# Patient Record
Sex: Male | Born: 1960 | Hispanic: No | Marital: Married | Smoking: Never smoker
Health system: Southern US, Community
[De-identification: ages and names within clinical notes are randomized; demographics above are authoritative.]

## PROBLEM LIST (undated history)

## (undated) DIAGNOSIS — K620 Anal polyp: Secondary | ICD-10-CM

## (undated) DIAGNOSIS — M5412 Radiculopathy, cervical region: Secondary | ICD-10-CM

## (undated) DIAGNOSIS — I10 Essential (primary) hypertension: Secondary | ICD-10-CM

## (undated) DIAGNOSIS — R7303 Prediabetes: Secondary | ICD-10-CM

## (undated) DIAGNOSIS — N400 Enlarged prostate without lower urinary tract symptoms: Secondary | ICD-10-CM

## (undated) DIAGNOSIS — J302 Other seasonal allergic rhinitis: Secondary | ICD-10-CM

## (undated) HISTORY — PX: NO PAST SURGERIES: SHX2092

## (undated) HISTORY — DX: Anal polyp: K62.0

## (undated) HISTORY — DX: Benign prostatic hyperplasia without lower urinary tract symptoms: N40.0

## (undated) HISTORY — DX: Other seasonal allergic rhinitis: J30.2

---

## 1999-04-27 HISTORY — PX: HEMORRHOID SURGERY: SHX153

## 1999-10-09 ENCOUNTER — Ambulatory Visit (HOSPITAL_COMMUNITY): Admission: RE | Admit: 1999-10-09 | Discharge: 1999-10-09 | Payer: Self-pay | Admitting: General Surgery

## 2010-06-09 ENCOUNTER — Other Ambulatory Visit: Payer: Self-pay

## 2010-06-10 ENCOUNTER — Other Ambulatory Visit: Payer: Self-pay

## 2010-06-15 ENCOUNTER — Other Ambulatory Visit (INDEPENDENT_AMBULATORY_CARE_PROVIDER_SITE_OTHER): Payer: Self-pay

## 2010-06-15 DIAGNOSIS — R0989 Other specified symptoms and signs involving the circulatory and respiratory systems: Secondary | ICD-10-CM

## 2010-09-04 ENCOUNTER — Encounter (INDEPENDENT_AMBULATORY_CARE_PROVIDER_SITE_OTHER): Payer: Self-pay | Admitting: Surgery

## 2010-09-23 ENCOUNTER — Encounter: Payer: Self-pay | Admitting: *Deleted

## 2010-09-29 ENCOUNTER — Encounter (INDEPENDENT_AMBULATORY_CARE_PROVIDER_SITE_OTHER): Payer: Self-pay | Admitting: *Deleted

## 2010-09-29 ENCOUNTER — Encounter: Payer: Self-pay | Admitting: Cardiovascular Disease

## 2010-12-23 ENCOUNTER — Ambulatory Visit (INDEPENDENT_AMBULATORY_CARE_PROVIDER_SITE_OTHER): Payer: 59 | Admitting: Surgery

## 2010-12-23 ENCOUNTER — Encounter (INDEPENDENT_AMBULATORY_CARE_PROVIDER_SITE_OTHER): Payer: Self-pay | Admitting: Surgery

## 2010-12-23 ENCOUNTER — Encounter (INDEPENDENT_AMBULATORY_CARE_PROVIDER_SITE_OTHER): Payer: Self-pay

## 2010-12-23 VITALS — BP 142/94 | HR 70 | Temp 96.7°F | Ht 68.5 in | Wt 193.8 lb

## 2010-12-23 DIAGNOSIS — K620 Anal polyp: Secondary | ICD-10-CM

## 2010-12-23 DIAGNOSIS — K621 Rectal polyp: Secondary | ICD-10-CM

## 2010-12-23 NOTE — Progress Notes (Signed)
Subjective:     Patient ID: Tony Hall, male   DOB: 1960-11-15, 50 y.o.   MRN: [id number]  HPI  Reason for visit: Followup on the anal canal polyp.  The patient comes today feeling well. No bleeding. Bowel movements twice a day. No prolapse nor any anall masses. No fevers or chills or sweats.  Review of Systems  Constitutional: Negative for fever, chills and diaphoresis.  HENT: Negative for nosebleeds, sore throat, facial swelling, mouth sores, trouble swallowing and ear discharge.   Eyes: Negative for photophobia, discharge and visual disturbance.  Respiratory: Negative for choking, chest tightness, shortness of breath and stridor.   Cardiovascular: Negative for chest pain and palpitations.  Gastrointestinal: Negative for nausea, vomiting, abdominal pain, diarrhea, constipation, blood in stool, abdominal distention, anal bleeding and rectal pain.  Genitourinary: Negative for dysuria, urgency, difficulty urinating and testicular pain.  Musculoskeletal: Negative for myalgias, back pain, arthralgias and gait problem.  Skin: Negative for color change, pallor, rash and wound.  Neurological: Negative for dizziness, speech difficulty, weakness, numbness and headaches.  Hematological: Negative for adenopathy. Does not bruise/bleed easily.  Psychiatric/Behavioral: Negative for hallucinations, confusion and agitation.       Objective:   Physical Exam  Constitutional: He is oriented to person, place, and time. He appears well-developed and well-nourished. No distress.  HENT:  Head: Normocephalic.  Mouth/Throat: Oropharynx is clear and moist. No oropharyngeal exudate.  Eyes: Conjunctivae and EOM are normal. Pupils are equal, round, and reactive to light. No scleral icterus.  Neck: Normal range of motion. No tracheal deviation present.  Cardiovascular: Regular rhythm and intact distal pulses.   Pulmonary/Chest: Effort normal. No respiratory distress.  Abdominal: Soft. He exhibits no  distension. There is no tenderness. Hernia confirmed negative in the right inguinal area and confirmed negative in the left inguinal area.  Genitourinary: Prostate normal and penis normal. No penile tenderness.       Perianal skin clean.  No masses/tags.  NST.  Right lateral anal tag/polyp.  Conical, soft, 5x86mm.  No wart/ulcer.  No other rectal/anal masses.  Musculoskeletal: Normal range of motion. He exhibits no tenderness.  Lymphadenopathy:       Right: No inguinal adenopathy present.       Left: No inguinal adenopathy present.  Neurological: He is alert and oriented to person, place, and time. No cranial nerve deficit. He exhibits normal muscle tone. Coordination normal.  Skin: Skin is warm and dry. No rash noted. He is not diaphoretic. No erythema. No pallor.  Psychiatric: He has a normal mood and affect. His behavior is normal. Judgment and thought content normal.       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    Plan:     I again offered him options. We could see him in a year to make sure nothing has changed. I feel pretty reassured since nothing is changed in a year the that this is not anything concerning. It does not look like a condyloma. It is not highly suspicious for cancer.  Another reasonable plan is to consider just excision of the mass.  This would be an outpatient procedure. I think it is small &  localized so I my hope that he would not have much risks of bleeding or prolonged pain or other problems.  The anatomy & physiology of the anorectal region was discussed.  The pathophysiology of hemorrhoids and differential diagnosis was discussed.  Natural history risks without surgery was discussed.  Risks such as bleeding, infection, need for further treatment, heart attack, death, and other risks were discussed.  Goals of post-operative recovery were discussed as well.  Possibility that this will not correct all symptoms was explained.   Post-operative pain, bleeding, constipation, and other problems after surgery were discussed.  We will work to minimize complications.   Questions were answered.  The patient expresses understanding & again, he wishes to wait. He feels reassured. I did caution him that I cannot 100% guarantee that this is benign outside of removing it. He understands this.   I will see him in a year for followup.

## 2011-12-29 ENCOUNTER — Ambulatory Visit (INDEPENDENT_AMBULATORY_CARE_PROVIDER_SITE_OTHER): Payer: 59 | Admitting: Surgery

## 2011-12-29 ENCOUNTER — Encounter (INDEPENDENT_AMBULATORY_CARE_PROVIDER_SITE_OTHER): Payer: Self-pay | Admitting: Surgery

## 2011-12-29 VITALS — BP 128/86 | HR 76 | Temp 97.6°F | Resp 16 | Ht 69.5 in | Wt 199.4 lb

## 2011-12-29 DIAGNOSIS — K620 Anal polyp: Secondary | ICD-10-CM

## 2011-12-29 NOTE — Progress Notes (Signed)
Subjective:     Patient ID: Tony Hall, male   DOB: 1961-04-13, 51 y.o.   MRN: [id number]  HPI  Tony Hall  Nov 18, 1960 [id number]  Patient Care Team: Darrow Bussing, MD as PCP - General (Family Medicine) Graylin Shiver, MD as Consulting Physician (Gastroenterology)  This patient is a 51 y.o.male who presents today for surgical evaluation   Reason for visit: Followup on anal canal mass.  Patient comes in today feeling well.  No bleeding.  Occasional itching with his hemorrhoids.  It is mild.  Not worse.  Having twice a day bowel movements on a bowel regimen.  Feels well.  No fevers or chills.  No pain with bowel movements  Patient Active Problem List  Diagnosis  . Anal polyp - benign appearing    Past Medical History  Diagnosis Date  . Anal polyp [id number]    Past Surgical History  Procedure Date  . Hemorrhoid surgery 2001    History   Social History  . Marital Status: Married    Spouse Name: N/A    Number of Children: N/A  . Years of Education: N/A   Occupational History  . Not on file.   Social History Main Topics  . Smoking status: Never Smoker   . Smokeless tobacco: Not on file  . Alcohol Use: Yes     5 GLASSES OF WINE PER WEEK  . Drug Use: No  . Sexually Active:    Other Topics Concern  . Not on file   Social History Narrative  . No narrative on file    Family History  Problem Relation Age of Onset  . Hypertension Father   . Dementia Father     Current Outpatient Prescriptions  Medication Sig Dispense Refill  . Tamsulosin HCl (FLOMAX) 0.4 MG CAPS Take by mouth daily.           No Known Allergies  BP 128/86  Pulse 76  Temp 97.6 F (36.4 C) (Temporal)  Resp 16  Ht 5' 9.5" (1.765 m)  Wt 199 lb 6.4 oz (90.447 kg)  BMI 29.02 kg/m2  No results found.   Review of Systems  Constitutional: Negative for fever, chills and diaphoresis.  HENT: Negative for sore throat, trouble swallowing and neck pain.   Eyes: Negative for photophobia and  visual disturbance.  Respiratory: Negative for choking and shortness of breath.   Cardiovascular: Negative for chest pain and palpitations.  Gastrointestinal: Negative for nausea, vomiting, abdominal distention, anal bleeding and rectal pain.  Genitourinary: Negative for dysuria, urgency, difficulty urinating and testicular pain.  Musculoskeletal: Negative for myalgias, arthralgias and gait problem.  Skin: Negative for color change and rash.  Neurological: Negative for dizziness, speech difficulty, weakness and numbness.  Hematological: Negative for adenopathy.  Psychiatric/Behavioral: Negative for hallucinations, confusion and agitation.       Objective:   Physical Exam  Constitutional: He is oriented to person, place, and time. He appears well-developed and well-nourished. No distress.  HENT:  Head: Normocephalic.  Mouth/Throat: Oropharynx is clear and moist. No oropharyngeal exudate.  Eyes: Conjunctivae and EOM are normal. Pupils are equal, round, and reactive to light. No scleral icterus.  Neck: Normal range of motion. No tracheal deviation present.  Cardiovascular: Normal rate, normal heart sounds and intact distal pulses.   Pulmonary/Chest: Effort normal. No respiratory distress.  Abdominal: Soft. He exhibits no distension. There is no tenderness. Hernia confirmed negative in the right inguinal area and confirmed negative in the left inguinal area.  Incisions clean with normal healing ridges.  No hernias  Genitourinary:       Exam done with assistance of male Medical Assistant in the room.  Perianal skin clean with good hygiene.  No pruritis.  No external skin tags / hemorrhoids of significance.  Mild hypopigmented old scar c/w prior hemorrhoidectomies.  No pilonidal disease.  No fissure.  No abscess/fistula.    Tolerates digital  and anoscopic rectal exam.  Normal sphincter tone.  Hemorrhoidal piles WNL except anal canal fold/polyp on R posterior hemorrhoid.  Soft/smooth no  wart   Musculoskeletal: Normal range of motion. He exhibits no tenderness.  Neurological: He is alert and oriented to person, place, and time. No cranial nerve deficit. He exhibits normal muscle tone. Coordination normal.  Skin: Skin is warm and dry. No rash noted. He is not diaphoretic.  Psychiatric: He has a normal mood and affect. His behavior is normal.       Assessment:     Anal canal mass.  Small polyp.  Favor old scar from hemorrhoidectomy.  No change in two years.  Benign-appearing.    Plan:     At this point, the mass is small and has not changed.  There is no change in over two years.  We can continue to do annual checks, however, at this point I think The chance this is anything concerning his very low since it his remain small and stable and benign-appearing for over two years.  If he wishes I can follow this out to five years.  The final option would be to excise this.  He is happy to call me if he needs me.  If any changes he'll let me know.

## 2019-03-08 ENCOUNTER — Ambulatory Visit: Payer: Managed Care, Other (non HMO) | Attending: Family Medicine | Admitting: Physical Therapy

## 2019-03-08 ENCOUNTER — Other Ambulatory Visit: Payer: Self-pay

## 2019-03-08 ENCOUNTER — Encounter: Payer: Self-pay | Admitting: Physical Therapy

## 2019-03-08 DIAGNOSIS — M6281 Muscle weakness (generalized): Secondary | ICD-10-CM | POA: Insufficient documentation

## 2019-03-08 DIAGNOSIS — M5441 Lumbago with sciatica, right side: Secondary | ICD-10-CM | POA: Diagnosis present

## 2019-03-08 NOTE — Patient Instructions (Signed)
Access Code: [email or web address]  URL: [email or web address]  Date: 03/08/2019  Prepared by: Ruben Im   Exercises  Prone Press Up - 10 reps - 1 sets - 8x daily - 7x weekly

## 2019-03-08 NOTE — Therapy (Signed)
[facility] [address] [place], [place], [address] Phone: [phone]   Fax:  [phone]  Physical Therapy Evaluation  Patient Details  Name: Tony Hall MRN: [id number] Date of Birth: [birth year] Referring Provider (PT): Dr. Darrow Bussing    Encounter Date: 03/08/2019  PT End of Session - 03/08/19 1903    Visit Number  1    Number of Visits  20    Date for PT Re-Evaluation  05/03/19    Authorization Type  [employer] 20 visit limit    PT Start Time  0801    PT Stop Time  0844    PT Time Calculation (min)  43 min    Activity Tolerance  Patient limited by pain       Past Medical History:  Diagnosis Date  . Anal polyp [id number]  . BPH (benign prostatic hyperplasia)   . Seasonal allergies     Past Surgical History:  Procedure Laterality Date  . HEMORRHOID SURGERY  2001   x3 piles.  Dr Kendrick [place]    There were no vitals filed for this visit.   Subjective Assessment - 03/08/19 5188    Subjective  Oct 1st helping son move furniture right low back to right thigh pain.  Took a steroid injection but didn't help.  MD recommended PT.  Pain worsened and right thigh numb constantly.    Pertinent History  no previous LBP    Limitations  House hold activities;Standing;Walking    How long can you sit comfortably?  sit all day for work 2 hours    How long can you walk comfortably?  short distances only;  10-15 yards    Diagnostic tests  no x-rays    Patient Stated Goals  get back to normalcy; walk and move around without hurting    Currently in Pain?  Yes    Pain Score  8     Pain Location  Back    Pain Orientation  Right    Pain Type  Acute pain    Pain Radiating Towards  right posterior, lateral, anterior thigh to knee    Pain Onset  More than a month ago    Pain Frequency  Constant    Aggravating Factors   standing, walking, night time    Pain Relieving Factors  fetal position         [facility] PT Assessment -  03/08/19 0001      Assessment   Medical Diagnosis  sciatica right     Referring Provider (PT)  Dr. Carilyn Goodpasture Koirala     Onset Date/Surgical Date  01/25/19    Next MD Visit  as needed     Prior Therapy  right shoulder       Precautions   Precautions  None      Restrictions   Weight Bearing Restrictions  No      Balance Screen   Has the patient fallen in the past 6 months  No    Has the patient had a decrease in activity level because of a fear of falling?   No   coming down steps and almost fell on steps   Is the patient reluctant to leave their home because of a fear of falling?   No      Home Nurse, mental health  Private residence    Living Arrangements  Spouse/significant other      Prior Function   Level of Independence  Independent  Vocation  Full time employment    Vocation Requirements  sitting     Leisure  3 teenage kids hang out with them;  used to play golf, fish       Observation/Other Assessments   Focus on Therapeutic Outcomes (FOTO)   57% limitation       Posture/Postural Control   Posture Comments  left lumbar lateral shift in standing and sitting       AROM   Overall AROM Comments  no clear directional preference with repeated movement testing     Lumbar Flexion  20    Lumbar Extension  15    Lumbar - Right Side Bend  18    Lumbar - Left Side Bend  40      Strength   Overall Strength Comments  right knee and ankle strength WNLs     Right Hip ABduction  3+/5    Left Hip ABduction  4/5    Lumbar Flexion  3+/5    Lumbar Extension  3+/5      Special Tests   Other special tests  No pain with cough/sneeze;  no bowel/bladder disturbance;  no LE giveway       Prone Knee Bend Test   Findings  Positive    Side  Right      Straight Leg Raise   Findings  Negative      Ambulation/Gait   Gait Pattern  Antalgic;Lateral trunk lean to left    Gait Comments  decreased stance time in right;  forward flexed                 Objective  measurements completed on examination: See above findings.      [facility] Adult PT Treatment/Exercise - 03/08/19 0001      Moist Heat Therapy   Number Minutes Moist Heat  10 Minutes    Moist Heat Location  Lumbar Spine      Electrical Stimulation   Electrical Stimulation Location  right lumbar    Electrical Stimulation Action  IFC    Electrical Stimulation Parameters  7 ma 10 min prone over 1 pillow    Electrical Stimulation Goals  Pain             PT Education - 03/08/19 1902    Education Details  Access Code: [email or web address]   trial of prone press ups; discussion of centralization principles    Person(s) Educated  Patient    Methods  Explanation;Demonstration;Handout    Comprehension  Returned demonstration;Verbalized understanding       PT Short Term Goals - 03/08/19 1918      PT SHORT TERM GOAL #1   Title  The patient will report understanding of basic self care principles to promote healing, basic exercises to promote centralization    Time  4    Period  Weeks    Status  New    Target Date  04/05/19      PT SHORT TERM GOAL #2   Title  The patient will report a 30% improvement in right low back pain and right thigh symptoms    Time  4    Period  Weeks    Status  New      PT SHORT TERM GOAL #3   Title  Patient will be able to walk 5-10 minutes with pain level 5/10 or less    Time  4    Period  Weeks    Status  New  PT SHORT TERM GOAL #4   Title  The patient will have improved lumbar extension to 20 degrees and right side bending to 30 degrees    Time  4    Period  Weeks    Status  New        PT Long Term Goals - 03/08/19 1922      PT LONG TERM GOAL #1   Title  The patient will be independent in safe self progression of HEP    Time  8    Period  Weeks    Status  New    Target Date  05/03/19      PT LONG TERM GOAL #2   Title  The patient will report a 60% improvement in back and right thigh symptoms with standing and walking    Time  8    Period   Weeks    Status  New      PT LONG TERM GOAL #3   Title  The patient will be able to walk 15 minutes with pain level 4/10 or less    Time  8    Period  Weeks    Status  New      PT LONG TERM GOAL #4   Title  Trunk flexion, extension strength and right hip abduction strength improved to grossly 4/5 needed for standing, walking and stair climbing with greater ease    Time  8    Period  Weeks    Status  New      PT LONG TERM GOAL #5   Title  FOTO functional outcome score improved from 57% limitation to 35% limitation indicating improved function with less pain    Time  8    Period  Weeks    Status  New             Plan - 03/08/19 1905    Clinical Impression Statement  The patient reports the onset of right low back pain radiating into buttock and anterior/lateral thigh on October 1st after helping his son move furniture.   He reports his right thigh is numb constantly.  His gait is slow and antalgic with forward trunk lean and decreased stance time on right.  Stands and sits with left lateral shift.  Decreased lumbar ROM in all plane:  flexion 20, extension 15, right side bending 18 and left sidebending 40 degrees.  No clear directional preference with repeated movement testing however following press ups he has improved standing posture and gait pattern.  Right paraspinal muscle spasm.  Decreased trunk and hip abduction strength.  Negative SLR and positive right prone knee bend test.  He would benefit from PT to address these deficits.    Examination-Activity Limitations  Lift;Locomotion Level;Stairs;Sleep;Carry;Sit    Examination-Participation Restrictions  Community Activity;Other    Stability/Clinical Decision Making  Stable/Uncomplicated    Clinical Decision Making  Low    Rehab Potential  Good    PT Frequency  2x / week    PT Duration  8 weeks    PT Treatment/Interventions  ADLs/Self Care Home Management;Cryotherapy;Electrical Stimulation;Ultrasound;Traction;Moist  Heat;Neuromuscular re-education;Therapeutic exercise;Therapeutic activities;Patient/family education;Manual techniques;Dry needling;Spinal Manipulations    PT Next Visit Plan  assess response to prone press up trial and progress per McK[place];  may need shift correction;   possible mechanical traction;  ES/heat as needed    PT Home Exercise Plan  Access Code: [email or web address]    Consulted and Agree with Plan of Care  Patient  Patient will benefit from skilled therapeutic intervention in order to improve the following deficits and impairments:  Difficulty walking, Decreased range of motion, Increased muscle spasms, Pain, Decreased strength, Postural dysfunction, Decreased activity tolerance  Visit Diagnosis: Acute right-sided low back pain with right-sided sciatica - Plan: PT plan of care cert/re-cert  Muscle weakness (generalized) - Plan: PT plan of care cert/re-cert     Problem List Patient Active Problem List   Diagnosis Date Noted  . Anal polyp - benign appearing 08/22/2009   Lavinia Sharps, PT 03/08/19 7:27 PM Phone: [phone] Fax: [phone] Vivien Presto 03/08/2019, 7:27 PM  [facility] [address], [address] [place], [place], [address] Phone: [phone]   Fax:  [phone]  Name: Tony Hall MRN: [id number] Date of Birth: 09-29-1960

## 2019-03-12 ENCOUNTER — Encounter: Payer: Self-pay | Admitting: Physical Therapy

## 2019-03-12 ENCOUNTER — Other Ambulatory Visit: Payer: Self-pay

## 2019-03-12 ENCOUNTER — Ambulatory Visit: Payer: Managed Care, Other (non HMO) | Admitting: Physical Therapy

## 2019-03-12 DIAGNOSIS — M5441 Lumbago with sciatica, right side: Secondary | ICD-10-CM

## 2019-03-12 DIAGNOSIS — M6281 Muscle weakness (generalized): Secondary | ICD-10-CM

## 2019-03-12 NOTE — Therapy (Signed)
[facility] [address], [place], [place], [address] Phone: [phone]   Fax:  [phone]  Physical Therapy Treatment  Patient Details  Name: Tony Hall MRN: [id number] Date of Birth: 08/27/1960 Referring Provider (PT): Dr. Lujean Amel    Encounter Date: 03/12/2019  PT End of Session - 03/12/19 1445    Visit Number  2    Number of Visits  20    Date for PT Re-Evaluation  05/03/19    Authorization Type  [employer] 20 visit limit    PT Start Time  6270    PT Stop Time  1535    PT Time Calculation (min)  50 min    Activity Tolerance  Patient limited by pain    Behavior During Therapy  [facility] for tasks assessed/performed       Past Medical History:  Diagnosis Date  . Anal polyp [id number]  . BPH (benign prostatic hyperplasia)   . Seasonal allergies     Past Surgical History:  Procedure Laterality Date  . HEMORRHOID SURGERY  2001   x3 piles.  Dr Lennie Hummer    There were no vitals filed for this visit.  Subjective Assessment - 03/12/19 1450    Subjective  I am about the same. Wish I could say I was better.    Currently in Pain?  Yes    Pain Score  --   RT lumbar 5-6, RT hip & thigh 8-9/10 with numbness   Pain Descriptors / Indicators  Numbness;Constant    Aggravating Factors   Standing & walking    Pain Relieving Factors  laying flat    Multiple Pain Sites  No                       [facility] Adult PT Treatment/Exercise - 03/12/19 0001      Self-Care   Self-Care  --   Sleeping positions that are decompressive & supportive,      Lumbar Exercises: Stretches   Prone on Elbows Stretch  --   1 min   Press Ups  10 reps    Other Lumbar Stretch Exercise  Sciatic and femoral n glides 10x each, VC to go smaller ROM on femoral      Lumbar Exercises: Supine   Other Supine Lumbar Exercises  Diaphfragmatic breathing x 1 min post n. gliding  in decompression position.    Other Supine Lumbar Exercises   RTLE leg lengthener 5x 3 sec holds      Modalities   Modalities  Traction      Traction   Type of Traction  Lumbar    Max (lbs)  50    Hold Time  --   Static   Time  10               PT Short Term Goals - 03/08/19 1918      PT SHORT TERM GOAL #1   Title  The patient will report understanding of basic self care principles to promote healing, basic exercises to promote centralization    Time  4    Period  Weeks    Status  New    Target Date  04/05/19      PT SHORT TERM GOAL #2   Title  The patient will report a 30% improvement in right low back pain and right thigh symptoms    Time  4    Period  Weeks    Status  New      PT SHORT TERM GOAL #3   Title  Patient will be able to walk 5-10 minutes with pain level 5/10 or less    Time  4    Period  Weeks    Status  New      PT SHORT TERM GOAL #4   Title  The patient will have improved lumbar extension to 20 degrees and right side bending to 30 degrees    Time  4    Period  Weeks    Status  New        PT Long Term Goals - 03/08/19 1922      PT LONG TERM GOAL #1   Title  The patient will be independent in safe self progression of HEP    Time  8    Period  Weeks    Status  New    Target Date  05/03/19      PT LONG TERM GOAL #2   Title  The patient will report a 60% improvement in back and right thigh symptoms with standing and walking    Time  8    Period  Weeks    Status  New      PT LONG TERM GOAL #3   Title  The patient will be able to walk 15 minutes with pain level 4/10 or less    Time  8    Period  Weeks    Status  New      PT LONG TERM GOAL #4   Title  Trunk flexion, extension strength and right hip abduction strength improved to grossly 4/5 needed for standing, walking and stair climbing with greater ease    Time  8    Period  Weeks    Status  New      PT LONG TERM GOAL #5   Title  FOTO functional outcome score improved from 57% limitation to 35% limitation indicating improved function with  less pain    Time  8    Period  Weeks    Status  New            Plan - 03/12/19 1449    Clinical Impression Statement  Pt had increased symptoms with femoral nerve gliding in prone. Pt is improving with his prone pressups but it seems to not change his radicular symptoms. PTA instructed pt to continue with the press ups as long as they do not increase his leg symtpoms. Trial of mechanical lumbar traction today. RTLE symptoms MAYBE slightly less per pt post traction.    Examination-Activity Limitations  Lift;Locomotion Level;Stairs;Sleep;Carry;Sit    Examination-Participation Restrictions  Community Activity;Other    Stability/Clinical Decision Making  Stable/Uncomplicated    Rehab Potential  Good    PT Frequency  2x / week    PT Duration  8 weeks    PT Treatment/Interventions  ADLs/Self Care Home Management;Cryotherapy;Electrical Stimulation;Ultrasound;Traction;Moist Heat;Neuromuscular re-education;Therapeutic exercise;Therapeutic activities;Patient/family education;Manual techniques;Dry needling;Spinal Manipulations    PT Next Visit Plan  Assess effect of traction, either continue with it/modify parameters. Continue with McKenzie if pt showing signs of less radicular symptoms and begin neutral spine core strengthening/gentle ab setting.    PT Home Exercise Plan  Access Code: [email or web address]    Consulted and Agree with Plan of Care  Patient       Patient will benefit from skilled therapeutic intervention in order to improve the following deficits and impairments:  Difficulty walking, Decreased range of motion, Increased muscle  spasms, Pain, Decreased strength, Postural dysfunction, Decreased activity tolerance  Visit Diagnosis: Acute right-sided low back pain with right-sided sciatica  Muscle weakness (generalized)     Problem List Patient Active Problem List   Diagnosis Date Noted  . Anal polyp - benign appearing 08/22/2009    Khelani Kops, PTA 03/12/2019, 4:24 PM  [facility] [address] [place], [place], [address] Phone: [phone]   Fax:  [phone]  Name: Tony Hall MRN: [id number] Date of Birth: [birth year]

## 2019-03-14 ENCOUNTER — Other Ambulatory Visit: Payer: Self-pay

## 2019-03-14 ENCOUNTER — Ambulatory Visit: Payer: Managed Care, Other (non HMO) | Admitting: Physical Therapy

## 2019-03-14 ENCOUNTER — Encounter: Payer: Self-pay | Admitting: Physical Therapy

## 2019-03-14 DIAGNOSIS — M5441 Lumbago with sciatica, right side: Secondary | ICD-10-CM | POA: Diagnosis not present

## 2019-03-14 DIAGNOSIS — M6281 Muscle weakness (generalized): Secondary | ICD-10-CM

## 2019-03-14 NOTE — Patient Instructions (Signed)

## 2019-03-14 NOTE — Therapy (Signed)
[facility] [address] [place], [place], [address] Phone: [phone]   Fax:  [phone]  Physical Therapy Treatment  Patient Details  Name: Tony Hall MRN: [id number] Date of Birth: [birth year] Referring Provider (PT): Dr. Lujean Amel    Encounter Date: 03/14/2019  PT End of Session - 03/14/19 0852    Visit Number  3    Number of Visits  20    Date for PT Re-Evaluation  05/03/19    Authorization Type  [employer] 20 visit limit    PT Start Time  0845    PT Stop Time  0935    PT Time Calculation (min)  50 min    Activity Tolerance  Patient tolerated treatment well    Behavior During Therapy  [facility] for tasks assessed/performed       Past Medical History:  Diagnosis Date  . Anal polyp [id number]  . BPH (benign prostatic hyperplasia)   . Seasonal allergies     Past Surgical History:  Procedure Laterality Date  . HEMORRHOID SURGERY  2001   x3 piles.  Dr Lennie Hummer    There were no vitals filed for this visit.  Subjective Assessment - 03/14/19 0849    Subjective  No worse, MAYBE slightly less pain in RT lumbar but only slightly.    Currently in Pain?  --   RT lumbar nagging pain 6/10, RT ant thigh numb and pain 7-8/10   Multiple Pain Sites  No                       [facility] Adult PT Treatment/Exercise - 03/14/19 0001      Lumbar Exercises: Stretches   Prone on Elbows Stretch  --   Prone laying 1 min, then POE 2 min, FB 10 pressups   Prone on Elbows Stretch Limitations  Pt reports increased pain ant thigh.       Lumbar Exercises: Supine   Ab Set  10 reps    Clam  10 reps    Bent Knee Raise  10 reps      Traction   Type of Traction  Lumbar    Max (lbs)  70    Hold Time  --   Static   Time  10             PT Education - 03/14/19 0859    Education Details  TA Level 1 series in supine for begin gentle core work for [employer]) Educated  Patient    Methods   Explanation;Tactile cues;Verbal cues;Handout    Comprehension  Verbalized understanding;Returned demonstration       PT Short Term Goals - 03/08/19 1918      PT SHORT TERM GOAL #1   Title  The patient will report understanding of basic self care principles to promote healing, basic exercises to promote centralization    Time  4    Period  Weeks    Status  New    Target Date  04/05/19      PT SHORT TERM GOAL #2   Title  The patient will report a 30% improvement in right low back pain and right thigh symptoms    Time  4    Period  Weeks    Status  New      PT SHORT TERM GOAL #3   Title  Patient will be able to walk 5-10 minutes with pain level 5/10 or  less    Time  4    Period  Weeks    Status  New      PT SHORT TERM GOAL #4   Title  The patient will have improved lumbar extension to 20 degrees and right side bending to 30 degrees    Time  4    Period  Weeks    Status  New        PT Long Term Goals - 03/08/19 1922      PT LONG TERM GOAL #1   Title  The patient will be independent in safe self progression of HEP    Time  8    Period  Weeks    Status  New    Target Date  05/03/19      PT LONG TERM GOAL #2   Title  The patient will report a 60% improvement in back and right thigh symptoms with standing and walking    Time  8    Period  Weeks    Status  New      PT LONG TERM GOAL #3   Title  The patient will be able to walk 15 minutes with pain level 4/10 or less    Time  8    Period  Weeks    Status  New      PT LONG TERM GOAL #4   Title  Trunk flexion, extension strength and right hip abduction strength improved to grossly 4/5 needed for standing, walking and stair climbing with greater ease    Time  8    Period  Weeks    Status  New      PT LONG TERM GOAL #5   Title  FOTO functional outcome score improved from 57% limitation to 35% limitation indicating improved function with less pain    Time  8    Period  Weeks    Status  New            Plan -  03/14/19 [id number]    Clinical Impression Statement  Pt arrives today with essentially the same complaints. He is compliant with HEP. Today we added some level 1 supine core activation for additioal HEP. This series of exercises did not exacerbate his pain/symptoms. Pt did not have an adverse reaction to th emechanical traction so we increased the amount of pull today.    Examination-Activity Limitations  Lift;Locomotion Level;Stairs;Sleep;Carry;Sit    Examination-Participation Restrictions  Community Activity;Other    Stability/Clinical Decision Making  Stable/Uncomplicated    Rehab Potential  Good    PT Frequency  2x / week    PT Duration  8 weeks    PT Treatment/Interventions  ADLs/Self Care Home Management;Cryotherapy;Electrical Stimulation;Ultrasound;Traction;Moist Heat;Neuromuscular re-education;Therapeutic exercise;Therapeutic activities;Patient/family education;Manual techniques;Dry needling;Spinal Manipulations    PT Next Visit Plan  Assess todays traction as we increased the pull. Consider hip shifting with press ups? Pt plans on returning to MD after 2 weeks of PT.    PT Home Exercise Plan  Access Code: [email or web address]    Consulted and Agree with Plan of Care  Patient       Patient will benefit from skilled therapeutic intervention in order to improve the following deficits and impairments:     Visit Diagnosis: Acute right-sided low back pain with right-sided sciatica  Muscle weakness (generalized)     Problem List Patient Active Problem List   Diagnosis Date Noted  . Anal polyp - benign appearing 08/22/2009    Carly Applegate, PTA 03/14/2019,  9:22 AM   [facility] [address] [place], [place], [address] Phone: [phone]   Fax:  [phone]  Name: Tony Hall MRN: [id number] Date of Birth: March 27, 1961

## 2019-03-20 ENCOUNTER — Encounter: Payer: 59 | Admitting: Physical Therapy

## 2019-03-21 ENCOUNTER — Other Ambulatory Visit: Payer: Self-pay

## 2019-03-21 ENCOUNTER — Ambulatory Visit: Payer: Managed Care, Other (non HMO) | Admitting: Physical Therapy

## 2019-03-21 ENCOUNTER — Encounter: Payer: Self-pay | Admitting: Physical Therapy

## 2019-03-21 DIAGNOSIS — M5441 Lumbago with sciatica, right side: Secondary | ICD-10-CM | POA: Diagnosis not present

## 2019-03-21 DIAGNOSIS — M6281 Muscle weakness (generalized): Secondary | ICD-10-CM

## 2019-03-21 NOTE — Therapy (Signed)
[facility] [address], [place], [place], [address] Phone: [phone]   Fax:  [phone]  Physical Therapy Treatment  Patient Details  Name: Tony Hall MRN: [id number] Date of Birth: Dec 04, 1960 Referring Provider (PT): Dr. Lujean Amel    Encounter Date: 03/21/2019  PT End of Session - 03/21/19 1020    Visit Number  4    Number of Visits  20    Date for PT Re-Evaluation  05/03/19    Authorization Type  [employer] 20 visit limit    PT Start Time  1020    PT Stop Time  1055    PT Time Calculation (min)  35 min    Activity Tolerance  Patient tolerated treatment well    Behavior During Therapy  [facility] for tasks assessed/performed       Past Medical History:  Diagnosis Date  . Anal polyp [id number]  . BPH (benign prostatic hyperplasia)   . Seasonal allergies     Past Surgical History:  Procedure Laterality Date  . HEMORRHOID SURGERY  2001   x3 piles.  Dr Lennie Hummer    There were no vitals filed for this visit.  Subjective Assessment - 03/21/19 1022    Subjective  Essentially no different. Planning on seeing MD next. Mechanical traction not helpful in symptom reduction.    Currently in Pain?  Yes   Rt lumbar 6/10, Rt ant thigh constant 7-8/10   Pain Orientation  Right    Pain Descriptors / Indicators  Numbness;Constant    Aggravating Factors   Standing and walking    Pain Relieving Factors  laying flat    Multiple Pain Sites  No                       [facility] Adult PT Treatment/Exercise - 03/21/19 0001      Self-Care   Self-Care  Other Self-Care Comments    Other Self-Care Comments   Home TENS unit, where and how to purchase for home use if he finds beneficial.       Acupuncturist Location  Rt lumbar    Electrical Stimulation Action  IFC 80-150 HZ    Electrical Stimulation Parameters  15 min hooklying    Electrical Stimulation Goals  Pain                PT Short Term Goals - 03/21/19 1025      PT SHORT TERM GOAL #1   Title  The patient will report understanding of basic self care principles to promote healing, basic exercises to promote centralization    Time  4    Period  Weeks    Status  Partially Met   pt understands principles it just doesn't work.     PT SHORT TERM GOAL #2   Title  The patient will report a 30% improvement in right low back pain and right thigh symptoms    Time  4    Period  Weeks    Status  On-going   no change     PT SHORT TERM GOAL #3   Title  Patient will be able to walk 5-10 minutes with pain level 5/10 or less    Time  4    Period  Weeks    Status  On-going   6/10       PT Long Term Goals - 03/08/19 1922      PT LONG  TERM GOAL #1   Title  The patient will be independent in safe self progression of HEP    Time  8    Period  Weeks    Status  New    Target Date  05/03/19      PT LONG TERM GOAL #2   Title  The patient will report a 60% improvement in back and right thigh symptoms with standing and walking    Time  8    Period  Weeks    Status  New      PT LONG TERM GOAL #3   Title  The patient will be able to walk 15 minutes with pain level 4/10 or less    Time  8    Period  Weeks    Status  New      PT LONG TERM GOAL #4   Title  Trunk flexion, extension strength and right hip abduction strength improved to grossly 4/5 needed for standing, walking and stair climbing with greater ease    Time  8    Period  Weeks    Status  New      PT LONG TERM GOAL #5   Title  FOTO functional outcome score improved from 57% limitation to 35% limitation indicating improved function with less pain    Time  8    Period  Weeks    Status  New            Plan - 03/21/19 1021    Clinical Impression Statement  Pt arrives with essentially the same symptoms. He reports the desire to put PT on hold and go see his MD to discuss "next steps." He wants to put PT on hold until he sees  the MD because he realizes there is a chance he can resume PT if something like an injection happens. He will continue all HEP that does not exacerbate his symptoms. We did a trial of Estim to see if he may benefit from a home TENS unit.    Examination-Activity Limitations  Lift;Locomotion Level;Stairs;Sleep;Carry;Sit    Examination-Participation Restrictions  Community Activity;Other    Stability/Clinical Decision Making  Stable/Uncomplicated    Rehab Potential  Good    PT Frequency  2x / week    PT Duration  8 weeks    PT Treatment/Interventions  ADLs/Self Care Home Management;Cryotherapy;Electrical Stimulation;Ultrasound;Traction;Moist Heat;Neuromuscular re-education;Therapeutic exercise;Therapeutic activities;Patient/family education;Manual techniques;Dry needling;Spinal Manipulations    PT Next Visit Plan  Pt wants to be put on hold until he sees the MD to discuss next steps due to the unchanging nature of his pain and radicular symptoms.    PT Home Exercise Plan  Access Code: [email or web address]    Consulted and Agree with Plan of Care  Patient       Patient will benefit from skilled therapeutic intervention in order to improve the following deficits and impairments:  Difficulty walking, Decreased range of motion, Increased muscle spasms, Pain, Decreased strength, Postural dysfunction, Decreased activity tolerance  Visit Diagnosis: Acute right-sided low back pain with right-sided sciatica  Muscle weakness (generalized)     Problem List Patient Active Problem List   Diagnosis Date Noted  . Anal polyp - benign appearing 08/22/2009    Shontae Rosiles, PTA 03/21/2019, 10:47 AM  [facility] [address], [place], [place], [address] Phone: [phone]   Fax:  [phone]  Name: Kelijah Towry MRN: [id number] Date of Birth: [birth year]

## 2019-03-26 ENCOUNTER — Encounter: Payer: 59 | Admitting: Physical Therapy

## 2019-03-29 ENCOUNTER — Encounter: Payer: 59 | Admitting: Physical Therapy

## 2019-03-30 ENCOUNTER — Other Ambulatory Visit: Payer: Self-pay | Admitting: Family Medicine

## 2019-03-30 DIAGNOSIS — M5431 Sciatica, right side: Secondary | ICD-10-CM

## 2019-04-02 ENCOUNTER — Ambulatory Visit: Payer: Managed Care, Other (non HMO) | Admitting: Physical Therapy

## 2019-04-02 ENCOUNTER — Other Ambulatory Visit: Payer: Self-pay | Admitting: Family Medicine

## 2019-04-04 ENCOUNTER — Ambulatory Visit
Admission: RE | Admit: 2019-04-04 | Discharge: 2019-04-04 | Disposition: A | Discharge status: Home / Self Care | Payer: Managed Care, Other (non HMO) | Source: Ambulatory Visit | Attending: Family Medicine | Admitting: Family Medicine

## 2019-04-04 ENCOUNTER — Other Ambulatory Visit: Payer: Self-pay

## 2019-04-04 DIAGNOSIS — M5431 Sciatica, right side: Secondary | ICD-10-CM

## 2019-04-05 ENCOUNTER — Encounter: Payer: 59 | Admitting: Physical Therapy

## 2019-04-10 ENCOUNTER — Encounter: Payer: 59 | Admitting: Physical Therapy

## 2020-05-30 IMAGING — MR MR LUMBAR SPINE W/O CM
4 of 5 series · 26 of 48 positions shown · non-contrast
Comparison: None.

CLINICAL DATA: Low back pain worse on the right with numbness of
the right leg. Symptoms began 8 weeks ago.

EXAM:
MRI LUMBAR SPINE WITHOUT CONTRAST
TECHNIQUE: Multiplanar, multisequence MR imaging of the lumbar spine was
performed. No intravenous contrast was administered.

[Series 3: T2 · sagittal · 4.0mm · 1.09mm/px · 6 of 16 slices shown (1 of 2)]
[im 1/16]
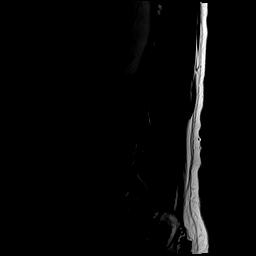
[im 4/16]
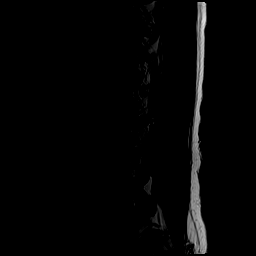
[im 7/16]
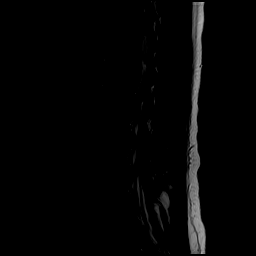
[im 10/16]
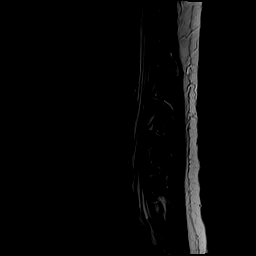
[im 13/16]
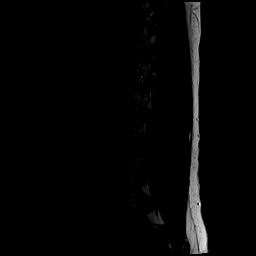
[im 16/16]
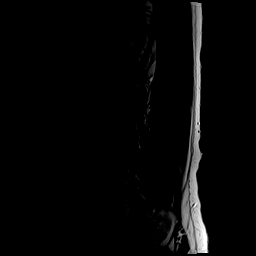

[Series 5: T1 · sagittal · 4.0mm · 1.09mm/px · 5 of 16 slices shown (1 of 2)]
[im 1/16]
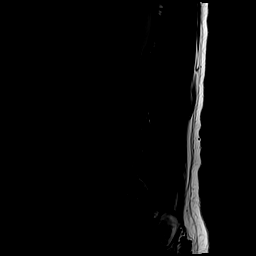
[im 4/16]
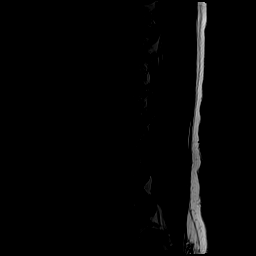
[im 8/16]
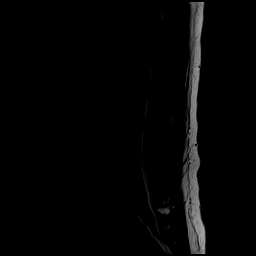
[im 12/16]
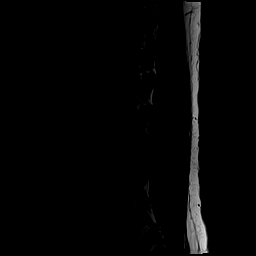
[im 16/16]
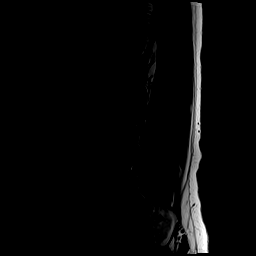

[Series 6: T2 · axial · 4.0mm · 0.39mm/px · z∈[-126,+95]mm · 10 of 48 slices shown (2 of 2)]
[im 4/48]
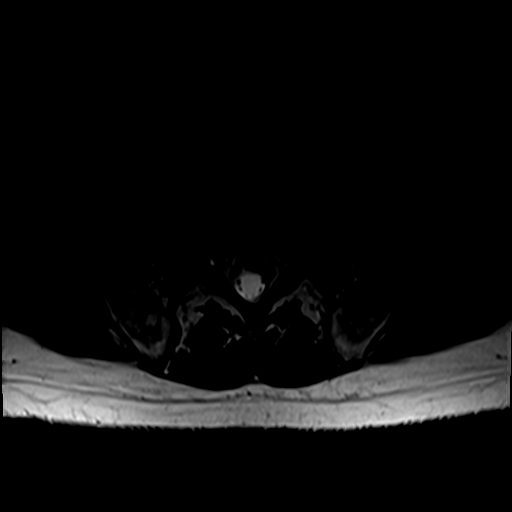
[im 7/48]
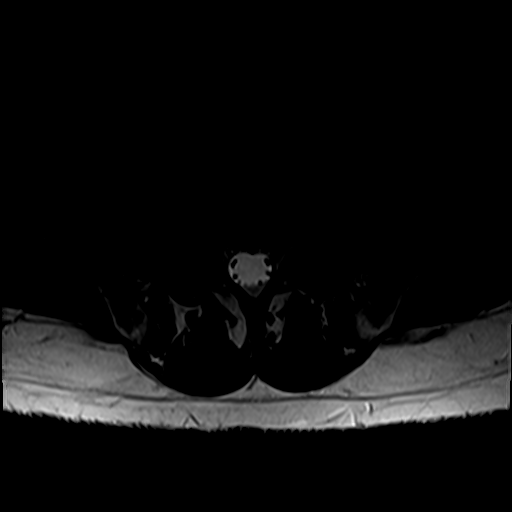
[im 10/48]
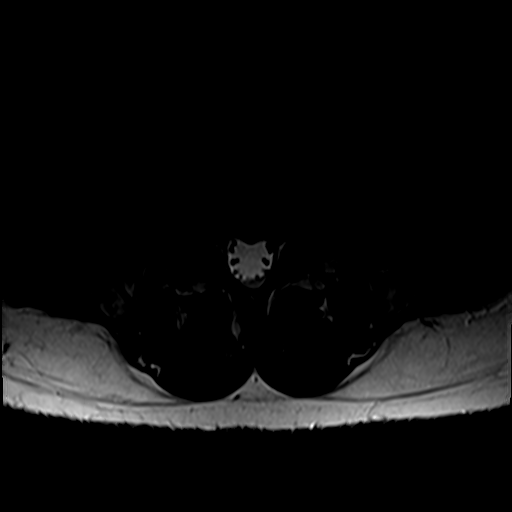
[im 16/48]
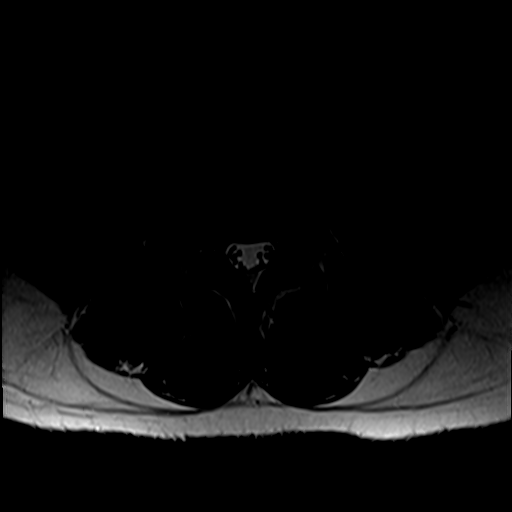
[im 22/48]
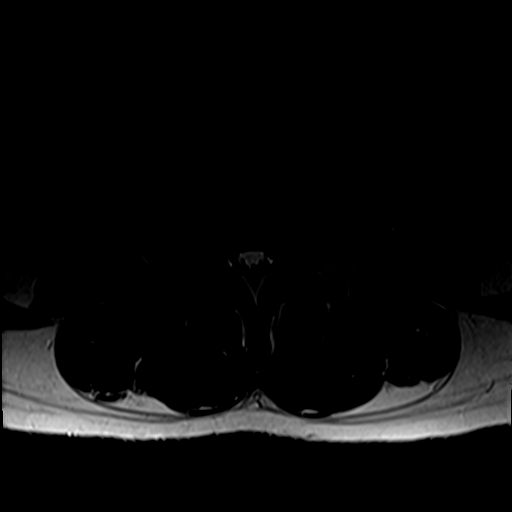
[im 26/48]
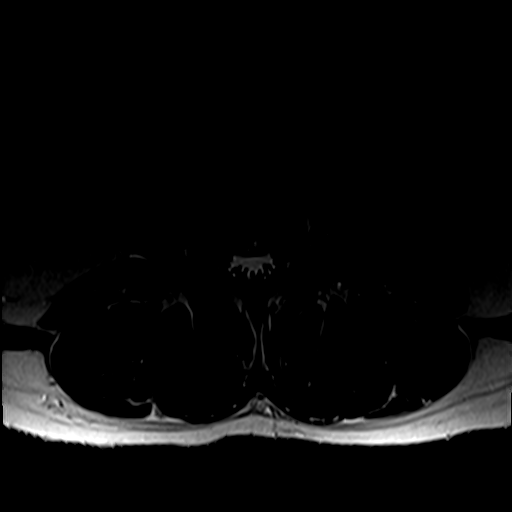
[im 29/48]
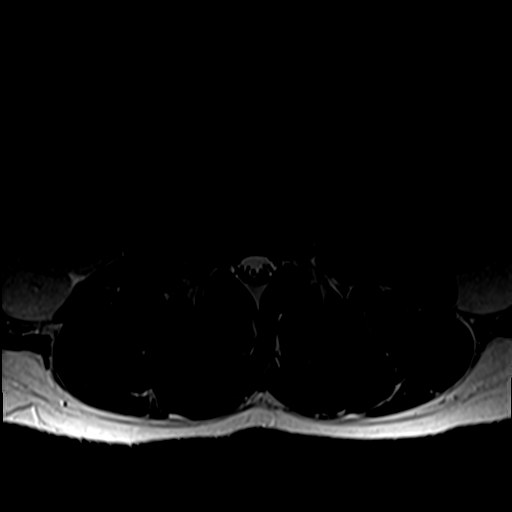
[im 35/48]
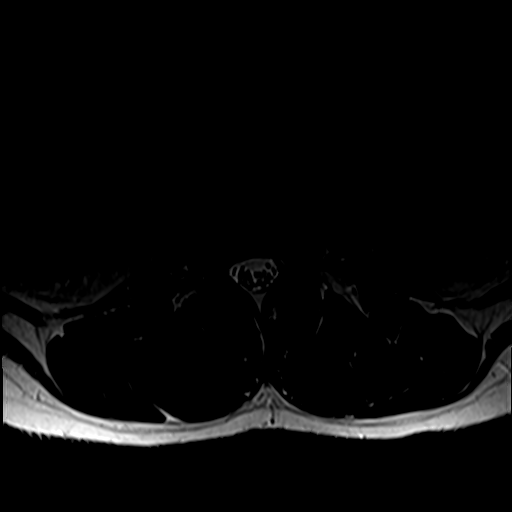
[im 41/48]
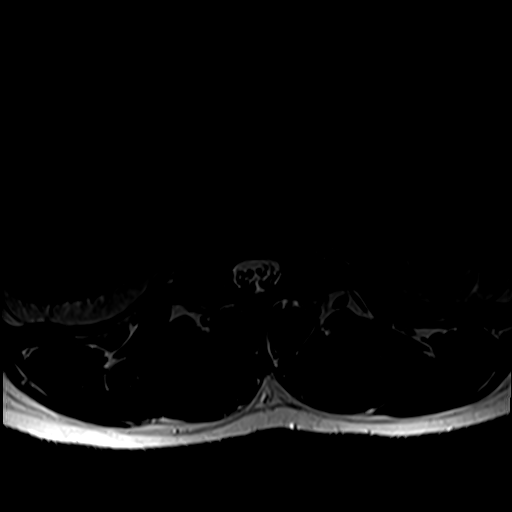
[im 48/48]
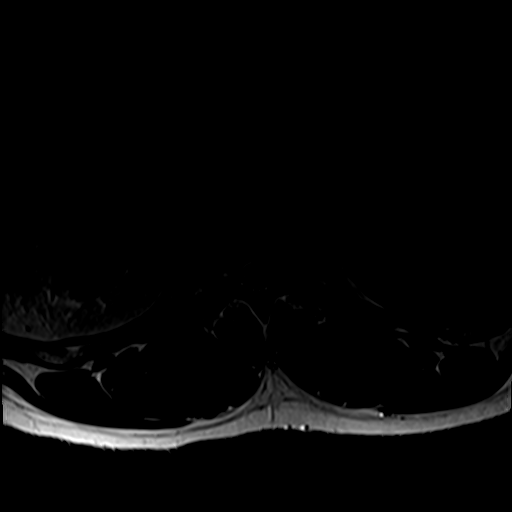

[Series 7: T1 · axial · 4.0mm · 0.39mm/px · z∈[-126,+61]mm · 5 of 48 slices shown (2 of 2)]
[im 4/48]
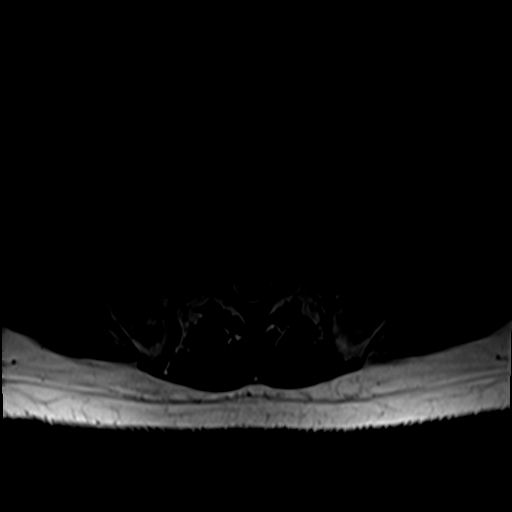
[im 7/48]
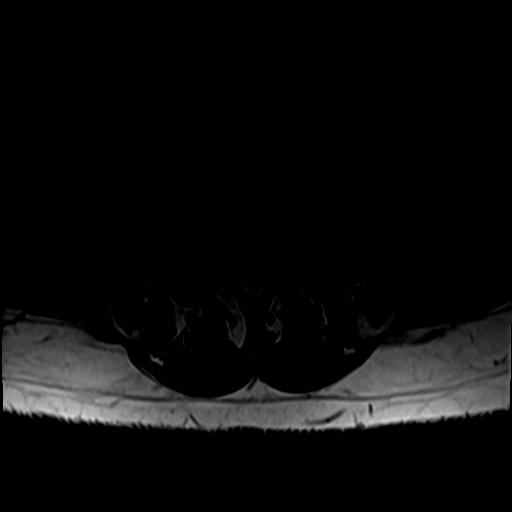
[im 10/48]
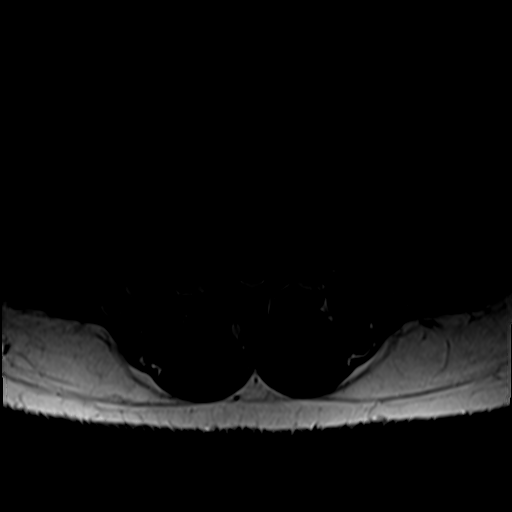
[im 26/48]
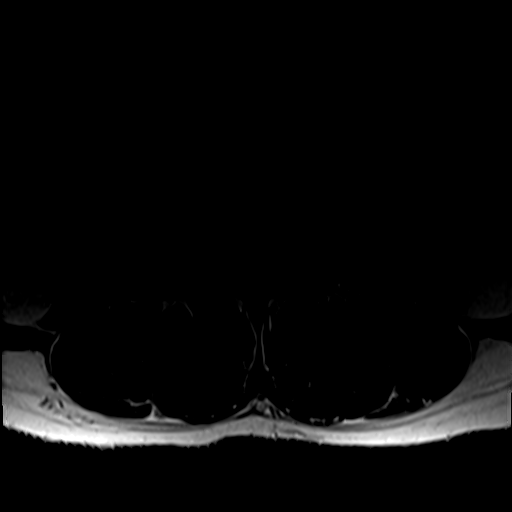
[im 41/48]
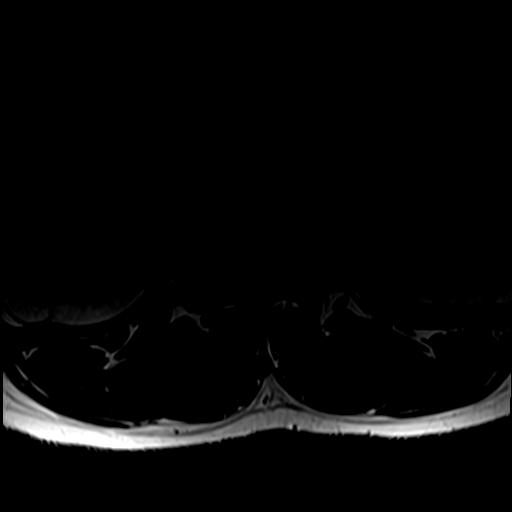

[26 of 48 positions shown; findings below may reference images not displayed]

FINDINGS: Segmentation:  5 lumbar type vertebral bodies.

Alignment:  No malalignment.

Vertebrae:  Normal

Conus medullaris and cauda equina: Conus extends to the T12-L1
level. Conus and cauda equina appear normal.

Paraspinal and other soft tissues: Normal. Parapelvic renal cysts
which are not pathologic.

Disc levels:

T11-12: This level was not studied in the axial plane. There is a
right posterolateral disc herniation that indents the thecal sac but
does not appear to compress the cord or show foraminal extension.
This would be unlikely to relate to the clinical presentation as
described.

T12-L1 and L1-2 are normal.

L2-3: Bulging of the disc. Small right foraminal disc herniation
which could affect the right L2 nerve.

L3-4: Bulging of the disc. Small right foraminal disc herniation
that could affect the right L3 nerve.

L4-5: Minimal noncompressive disc bulge. Mild facet osteoarthritis
without edema, slippage or encroachment upon the neural spaces.

L5-S1: Normal disc. Facet osteoarthritis worse on the right. No
compressive stenosis. The facet arthritis could contribute to back
pain or referred facet syndrome pain.
IMPRESSION: L2-3: Small right foraminal disc herniation could compress the right
L2 nerve.

L3-4: Tiny right foraminal disc herniation could affect the right L3
nerve.

L4-5 and L5-S1: Facet osteoarthritis which could contribute to low
back pain or referred facet syndrome pain. This is most pronounced
on the right at L5-S1 where there is some edema.

## 2023-09-05 ENCOUNTER — Other Ambulatory Visit: Payer: Self-pay | Admitting: Urology

## 2023-10-24 NOTE — Patient Instructions (Signed)
 SURGICAL WAITING ROOM VISITATION Patients having surgery or a procedure may have no more than 2 support people in the waiting area - these visitors may rotate in the visitor waiting room.   If the patient needs to stay at the hospital during part of their recovery, the visitor guidelines for inpatient rooms apply.  PRE-OP VISITATION  Pre-op nurse will coordinate an appropriate time for 1 support person to accompany the patient in pre-op.  This support person may not rotate.  This visitor will be contacted when the time is appropriate for the visitor to come back in the pre-op area.  Please refer to the [facility] website for the visitor guidelines for Inpatients (after your surgery is over and you are in a regular room).  You are not required to quarantine at this time prior to your surgery. However, you must do this: Hand Hygiene often Do NOT share personal items Notify your provider if you are in close contact with someone who has COVID or you develop fever 100.4 or greater, new onset of sneezing, cough, sore throat, shortness of breath or body aches.  If you test positive for Covid or have been in contact with anyone that has tested positive in the last 10 days please notify you surgeon.    Your procedure is scheduled on:  Monday  November 07, 2023  Report to [facility] Main Entrance: [address] entrance where the [employer] is available.   Report to admitting at:05:15     AM  Call this number if you have any questions or problems the morning of surgery [phone]  DO NOT EAT OR DRINK ANYTHING AFTER MIDNIGHT THE NIGHT PRIOR TO YOUR SURGERY / PROCEDURE.   FOLLOW  ANY ADDITIONAL PRE OP INSTRUCTIONS YOU RECEIVED FROM YOUR SURGEON'S OFFICE!!!   Oral Hygiene is also important to reduce your risk of infection.        Remember - BRUSH YOUR TEETH THE MORNING OF SURGERY WITH YOUR REGULAR TOOTHPASTE  Do NOT smoke after Midnight the night before surgery.  STOP TAKING all Vitamins,  Herbs and supplements 1 week before your surgery.   Take ONLY these medicines the morning of surgery with A SIP OF WATER: amlodipine, finasteride, Tamsulosin ?????                    You may not have any metal on your body including  jewelry, and body piercing  Do not wear  lotions, powders, cologne, or deodorant  Men may shave face and neck.  Contacts, Hearing Aids, dentures or bridgework may not be worn into surgery. DENTURES WILL BE REMOVED PRIOR TO SURGERY PLEASE DO NOT APPLY Poly grip OR ADHESIVES!!!  You may bring a small overnight bag with you on the day of surgery, only pack items that are not valuable. [facility] IS NOT RESPONSIBLE   FOR VALUABLES THAT ARE LOST OR STOLEN.   Do not bring your home medications to the hospital. The Pharmacy will dispense medications listed on your medication list to you during your admission in the Hospital.  Please read over the following fact sheets you were given: IF YOU HAVE QUESTIONS ABOUT YOUR PRE-OP INSTRUCTIONS, PLEASE CALL [phone].   [facility] - Preparing for Surgery Before surgery, you can play an important role.  Because skin is not sterile, your skin needs to be as free of germs as possible.  You can reduce the number of germs on your skin by washing with CHG (chlorahexidine gluconate) soap  before surgery.  CHG is an antiseptic cleaner which kills germs and bonds with the skin to continue killing germs even after washing. Please DO NOT use if you have an allergy to CHG or antibacterial soaps.  If your skin becomes reddened/irritated stop using the CHG and inform your nurse when you arrive at Short Stay. Do not shave (including legs and underarms) for at least 48 hours prior to the first CHG shower.  You may shave your face/neck.  Please follow these instructions carefully:  1.  Shower with CHG Soap the night before surgery and the  morning of surgery.  2.  If you choose to wash your hair, wash your hair first as usual with your  normal  shampoo.  3.  After you shampoo, rinse your hair and body thoroughly to remove the shampoo.                             4.  Use CHG as you would any other liquid soap.  You can apply chg directly to the skin and wash.  Gently with a scrungie or clean washcloth.  5.  Apply the CHG Soap to your body ONLY FROM THE NECK DOWN.   Do not use on face/ open                           Wound or open sores. Avoid contact with eyes, ears mouth and genitals (private parts).                       Wash face,  Genitals (private parts) with your normal soap.             6.  Wash thoroughly, paying special attention to the area where your  surgery  will be performed.  7.  Thoroughly rinse your body with warm water from the neck down.  8.  DO NOT shower/wash with your normal soap after using and rinsing off the CHG Soap.            9.  Pat yourself dry with a clean towel.            10.  Wear clean pajamas.            11.  Place clean sheets on your bed the night of your first shower and do not  sleep with pets.  ON THE DAY OF SURGERY : Do not apply any lotions/deodorants the morning of surgery.  Please wear clean clothes to the hospital/surgery center.    FAILURE TO FOLLOW THESE INSTRUCTIONS MAY RESULT IN THE CANCELLATION OF YOUR SURGERY  PATIENT SIGNATURE_________________________________  NURSE SIGNATURE__________________________________  ________________________________________________________________________

## 2023-10-24 NOTE — Progress Notes (Signed)
 COVID Vaccine received:  []  No [x]  Yes Date of any COVID positive Test in last 90 days:  PCP - Dibas Regino, MD at [facility]  [id number]  Cardiologist -   Chest x-ray -  EKG - 2012 Epic     will repeat at PST Stress Test -  ECHO -  Cardiac Cath -  CT Coronary Calcium score:   Bowel Prep - [x]  No  []   Yes ______  Pacemaker / ICD device [x]  No []  Yes   Spinal Cord Stimulator:[x]  No []  Yes       History of Sleep Apnea? [x]  No []  Yes   CPAP used?- [x]  No []  Yes    Does the patient monitor blood sugar?   []  N/A   []  No []  Yes  Patient has: []  NO Hx DM   [x]  Pre-DM   []  DM1  []   DM2 Last A1c was:  5.7   on 02-04-2023  no meds      Blood Thinner / Instructions:  none Aspirin Instructions:  none  ERAS Protocol Ordered: [x]  No  []  Yes Patient is to be NPO after: [place] Prior  Dental hx: []  Dentures:  []  N/A      []  Bridge or Partial:                   []  Loose or Damaged teeth:   Comments:   Activity level: Able to walk up 2 flights of stairs without becoming significantly short of breath or having chest pain?  []  No   []    Yes   Anesthesia review: HTN, Pre-DM, ETOH, cervical radiculopathy  Patient denies shortness of breath, fever, cough and chest pain at PAT appointment.  Patient verbalized understanding and agreement to the Pre-Surgical Instructions that were given to them at this PAT appointment. Patient was also educated of the need to review these PAT instructions again prior to his surgery.I reviewed the appropriate phone numbers to call if they have any and questions or concerns.

## 2023-10-26 ENCOUNTER — Encounter (HOSPITAL_COMMUNITY): Payer: Self-pay

## 2023-10-26 ENCOUNTER — Encounter (HOSPITAL_COMMUNITY)
Admission: RE | Admit: 2023-10-26 | Discharge: 2023-10-26 | Disposition: A | Discharge status: Home / Self Care | Source: Ambulatory Visit | Attending: Urology | Admitting: Urology

## 2023-10-26 ENCOUNTER — Other Ambulatory Visit: Payer: Self-pay

## 2023-10-26 VITALS — BP 118/73 | HR 74 | Temp 98.2°F | Resp 12 | Ht 71.0 in | Wt 194.0 lb

## 2023-10-26 DIAGNOSIS — R7303 Prediabetes: Secondary | ICD-10-CM | POA: Diagnosis not present

## 2023-10-26 DIAGNOSIS — Z01818 Encounter for other preprocedural examination: Secondary | ICD-10-CM | POA: Insufficient documentation

## 2023-10-26 DIAGNOSIS — Z01812 Encounter for preprocedural laboratory examination: Secondary | ICD-10-CM | POA: Diagnosis present

## 2023-10-26 DIAGNOSIS — I10 Essential (primary) hypertension: Secondary | ICD-10-CM | POA: Insufficient documentation

## 2023-10-26 DIAGNOSIS — Z01810 Encounter for preprocedural cardiovascular examination: Secondary | ICD-10-CM | POA: Diagnosis present

## 2023-10-26 HISTORY — DX: Prediabetes: R73.03

## 2023-10-26 HISTORY — DX: Essential (primary) hypertension: I10

## 2023-10-26 HISTORY — DX: Radiculopathy, cervical region: M54.12

## 2023-10-26 LAB — CBC
HCT: 46.3 % (ref 39.0–52.0)
Hemoglobin: 15.3 g/dL (ref 13.0–17.0)
MCH: 30.4 pg (ref 26.0–34.0)
MCHC: 33 g/dL (ref 30.0–36.0)
MCV: 91.9 fL (ref 80.0–100.0)
Platelets: 196 10*3/uL (ref 150–400)
RBC: 5.04 MIL/uL (ref 4.22–5.81)
RDW: 14.5 % (ref 11.5–15.5)
WBC: 6.3 10*3/uL (ref 4.0–10.5)
nRBC: 0 % (ref 0.0–0.2)

## 2023-10-26 LAB — COMPREHENSIVE METABOLIC PANEL WITH GFR
ALT: 41 U/L (ref 0–44)
AST: 28 U/L (ref 15–41)
Albumin: 4.2 g/dL (ref 3.5–5.0)
Alkaline Phosphatase: 62 U/L (ref 38–126)
Anion gap: 8 (ref 5–15)
BUN: 13 mg/dL (ref 8–23)
CO2: 25 mmol/L (ref 22–32)
Calcium: 9.5 mg/dL (ref 8.9–10.3)
Chloride: 105 mmol/L (ref 98–111)
Creatinine, Ser: 1.03 mg/dL (ref 0.61–1.24)
GFR, Estimated: >60 mL/min (ref >60)
Glucose, Bld: 108 mg/dL — ABNORMAL HIGH (ref 70–99)
Potassium: 4.1 mmol/L (ref 3.5–5.1)
Sodium: 138 mmol/L (ref 135–145)
Total Bilirubin: 0.8 mg/dL (ref 0.0–1.2)
Total Protein: 7.6 g/dL (ref 6.5–8.1)

## 2023-10-26 LAB — TYPE AND SCREEN
ABO/RH(D): O POS
Antibody Screen: NEGATIVE

## 2023-11-06 NOTE — Anesthesia Preprocedure Evaluation (Signed)
 Anesthesia Evaluation  Patient identified by MRN, date of birth, ID band Patient awake    Reviewed: Allergy & Precautions, NPO status , Patient's Chart, lab work & pertinent test results  Airway Mallampati: II  TM Distance: >3 FB Neck ROM: Full    Dental no notable dental hx. (+) Dental Advisory Given, Teeth Intact   Pulmonary neg pulmonary ROS   Pulmonary exam normal breath sounds clear to auscultation       Cardiovascular hypertension, Pt. on medications Normal cardiovascular exam Rhythm:Regular Rate:Normal     Neuro/Psych  Neuromuscular disease    GI/Hepatic negative GI ROS, Neg liver ROS,,,  Endo/Other  negative endocrine ROS    Renal/GU negative Renal ROS     Musculoskeletal negative musculoskeletal ROS (+)    Abdominal   Peds  Hematology negative hematology ROS (+)   Anesthesia Other Findings   Reproductive/Obstetrics                              Anesthesia Physical Anesthesia Plan  ASA: 2  Anesthesia Plan: General   Post-op Pain Management: Tylenol  PO (pre-op)*   Induction: Intravenous  PONV Risk Score and Plan: 2 and Ondansetron , Dexamethasone  and Treatment may vary due to age or medical condition  Airway Management Planned: Oral ETT and LMA  Additional Equipment:   Intra-op Plan:   Post-operative Plan: Extubation in OR  Informed Consent: I have reviewed the patients History and Physical, chart, labs and discussed the procedure including the risks, benefits and alternatives for the proposed anesthesia with the patient or authorized representative who has indicated his/her understanding and acceptance.     Dental advisory given  Plan Discussed with: CRNA  Anesthesia Plan Comments:          Anesthesia Quick Evaluation

## 2023-11-07 ENCOUNTER — Ambulatory Visit (HOSPITAL_COMMUNITY)
Admission: RE | Admit: 2023-11-07 | Discharge: 2023-11-07 | Disposition: A | Discharge status: Home / Self Care | Attending: Urology | Admitting: Urology

## 2023-11-07 ENCOUNTER — Other Ambulatory Visit: Payer: Self-pay

## 2023-11-07 ENCOUNTER — Encounter (HOSPITAL_COMMUNITY)
Admission: RE | Disposition: A | Discharge status: Home / Self Care | Payer: Self-pay | Source: Home / Self Care | Attending: Urology

## 2023-11-07 ENCOUNTER — Ambulatory Visit (HOSPITAL_BASED_OUTPATIENT_CLINIC_OR_DEPARTMENT_OTHER): Payer: Self-pay

## 2023-11-07 ENCOUNTER — Encounter (HOSPITAL_COMMUNITY): Payer: Self-pay | Admitting: Urology

## 2023-11-07 ENCOUNTER — Ambulatory Visit (HOSPITAL_COMMUNITY): Payer: Self-pay

## 2023-11-07 DIAGNOSIS — R350 Frequency of micturition: Secondary | ICD-10-CM | POA: Insufficient documentation

## 2023-11-07 DIAGNOSIS — R351 Nocturia: Secondary | ICD-10-CM | POA: Insufficient documentation

## 2023-11-07 DIAGNOSIS — I10 Essential (primary) hypertension: Secondary | ICD-10-CM | POA: Insufficient documentation

## 2023-11-07 DIAGNOSIS — Z79899 Other long term (current) drug therapy: Secondary | ICD-10-CM | POA: Diagnosis not present

## 2023-11-07 DIAGNOSIS — R3912 Poor urinary stream: Secondary | ICD-10-CM | POA: Diagnosis not present

## 2023-11-07 DIAGNOSIS — N401 Enlarged prostate with lower urinary tract symptoms: Secondary | ICD-10-CM | POA: Insufficient documentation

## 2023-11-07 DIAGNOSIS — R3914 Feeling of incomplete bladder emptying: Secondary | ICD-10-CM | POA: Diagnosis not present

## 2023-11-07 DIAGNOSIS — N529 Male erectile dysfunction, unspecified: Secondary | ICD-10-CM | POA: Insufficient documentation

## 2023-11-07 LAB — ABO/RH: ABO/RH(D): O POS

## 2023-11-07 SURGERY — ABLATION, PROSTATE, TRANSURETHRAL, USING WATERJET
Anesthesia: General

## 2023-11-07 MED ORDER — FENTANYL CITRATE (PF) 100 MCG/2ML IJ SOLN
INTRAMUSCULAR | Status: AC
  Filled 2023-11-07: qty 2

## 2023-11-07 MED ORDER — PROPOFOL 10 MG/ML IV BOLUS
INTRAVENOUS | Status: DC | PRN
  Administered 2023-11-07: 50 mg via INTRAVENOUS
  Administered 2023-11-07: 150 mg via INTRAVENOUS

## 2023-11-07 MED ORDER — MIDAZOLAM HCL 5 MG/5ML IJ SOLN
INTRAMUSCULAR | Status: DC | PRN
Start: 2023-11-07 — End: 2023-11-07
  Administered 2023-11-07: 2 mg via INTRAVENOUS

## 2023-11-07 MED ORDER — DROPERIDOL 2.5 MG/ML IJ SOLN: 0.6250 mg | Freq: Once | INTRAMUSCULAR | Status: DC | PRN

## 2023-11-07 MED ORDER — ROCURONIUM BROMIDE 100 MG/10ML IV SOLN
INTRAVENOUS | Status: DC | PRN
  Administered 2023-11-07: 40 mg via INTRAVENOUS
  Administered 2023-11-07 (×2): 10 mg via INTRAVENOUS
  Administered 2023-11-07: 40 mg via INTRAVENOUS

## 2023-11-07 MED ORDER — ROCURONIUM BROMIDE 10 MG/ML (PF) SYRINGE
PREFILLED_SYRINGE | INTRAVENOUS | Status: AC
  Filled 2023-11-07: qty 10

## 2023-11-07 MED ORDER — ONDANSETRON HCL 4 MG/2ML IJ SOLN
INTRAMUSCULAR | Status: DC | PRN
  Administered 2023-11-07: 4 mg via INTRAVENOUS

## 2023-11-07 MED ORDER — ACETAMINOPHEN 500 MG PO TABS
1000.0000 mg | ORAL_TABLET | Freq: Once | ORAL | Status: AC
  Administered 2023-11-07: 1000 mg via ORAL
  Filled 2023-11-07: qty 2

## 2023-11-07 MED ORDER — PROPOFOL 10 MG/ML IV BOLUS
INTRAVENOUS | Status: AC
  Filled 2023-11-07: qty 20

## 2023-11-07 MED ORDER — LIDOCAINE HCL (PF) 2 % IJ SOLN
INTRAMUSCULAR | Status: AC
  Filled 2023-11-07: qty 5

## 2023-11-07 MED ORDER — TRANEXAMIC ACID-NACL 1000-0.7 MG/100ML-% IV SOLN
1000.0000 mg | INTRAVENOUS | Status: AC
  Administered 2023-11-07: 1000 mg via INTRAVENOUS
  Filled 2023-11-07: qty 100

## 2023-11-07 MED ORDER — FENTANYL CITRATE (PF) 100 MCG/2ML IJ SOLN
INTRAMUSCULAR | Status: DC | PRN
  Administered 2023-11-07 (×3): 50 ug via INTRAVENOUS

## 2023-11-07 MED ORDER — LACTATED RINGERS IV SOLN: INTRAVENOUS | Status: DC

## 2023-11-07 MED ORDER — SODIUM CHLORIDE 0.9 % IV SOLN
2.0000 g | INTRAVENOUS | Status: AC
  Administered 2023-11-07: 2 g via INTRAVENOUS
  Filled 2023-11-07: qty 20

## 2023-11-07 MED ORDER — HYDROCODONE-ACETAMINOPHEN 5-325 MG PO TABS: 1.0000 | ORAL_TABLET | Freq: Four times a day (QID) | ORAL | 0 refills | Status: AC | PRN

## 2023-11-07 MED ORDER — SUGAMMADEX SODIUM 200 MG/2ML IV SOLN
INTRAVENOUS | Status: DC | PRN
  Administered 2023-11-07: 350 mg via INTRAVENOUS

## 2023-11-07 MED ORDER — CHLORHEXIDINE GLUCONATE 0.12 % MT SOLN
15.0000 mL | Freq: Once | OROMUCOSAL | Status: AC
  Administered 2023-11-07: 15 mL via OROMUCOSAL

## 2023-11-07 MED ORDER — ONDANSETRON HCL 4 MG/2ML IJ SOLN
INTRAMUSCULAR | Status: AC
  Filled 2023-11-07: qty 2

## 2023-11-07 MED ORDER — ORAL CARE MOUTH RINSE: 15.0000 mL | Freq: Once | OROMUCOSAL | Status: AC

## 2023-11-07 MED ORDER — MIDAZOLAM HCL 2 MG/2ML IJ SOLN
INTRAMUSCULAR | Status: AC
  Filled 2023-11-07: qty 2

## 2023-11-07 MED ORDER — DEXAMETHASONE SODIUM PHOSPHATE 10 MG/ML IJ SOLN
INTRAMUSCULAR | Status: AC
  Filled 2023-11-07: qty 1

## 2023-11-07 MED ORDER — STERILE WATER FOR IRRIGATION IR SOLN
Status: DC | PRN
  Administered 2023-11-07: 500 mL

## 2023-11-07 MED ORDER — SODIUM CHLORIDE 0.9 % IR SOLN
Status: DC | PRN
  Administered 2023-11-07: 6000 mL via INTRAVESICAL
  Administered 2023-11-07: 9000 mL via INTRAVESICAL

## 2023-11-07 MED ORDER — DEXAMETHASONE SODIUM PHOSPHATE 10 MG/ML IJ SOLN
INTRAMUSCULAR | Status: DC | PRN
  Administered 2023-11-07: 4 mg via INTRAVENOUS

## 2023-11-07 MED ORDER — FENTANYL CITRATE PF 50 MCG/ML IJ SOSY: 25.0000 ug | PREFILLED_SYRINGE | INTRAMUSCULAR | Status: DC | PRN

## 2023-11-07 MED ORDER — LIDOCAINE HCL (CARDIAC) PF 100 MG/5ML IV SOSY
PREFILLED_SYRINGE | INTRAVENOUS | Status: DC | PRN
  Administered 2023-11-07: 80 mg via INTRAVENOUS

## 2023-11-07 MED ORDER — PROPOFOL 10 MG/ML IV BOLUS
INTRAVENOUS | Status: AC
Start: 2023-11-07 — End: 2023-11-07
  Filled 2023-11-07: qty 20

## 2023-11-07 SURGICAL SUPPLY — 24 items
BAG URINE DRAIN 2000ML AR STRL (UROLOGICAL SUPPLIES) ×1 IMPLANT
CANISTER SUCT 3000ML PPV (MISCELLANEOUS) ×1 IMPLANT
CATH HEMA 3WAY 30CC 22FR COUDE (CATHETERS) IMPLANT
CATH HEMA 3WAY 30CC 24FR COUDE (CATHETERS) IMPLANT
COVER MAYO STAND STRL (DRAPES) ×1 IMPLANT
DRAPE FOOT SWITCH (DRAPES) ×1 IMPLANT
GEL ULTRASOUND 8.5O AQUASONIC (MISCELLANEOUS) ×1 IMPLANT
GLOVE BIO SURGEON STRL SZ7.5 (GLOVE) ×1 IMPLANT
GOWN STRL REUS W/ TWL XL LVL3 (GOWN DISPOSABLE) ×1 IMPLANT
HANDPIECE AQUABEAM (MISCELLANEOUS) ×1 IMPLANT
HOLDER FOLEY CATH W/STRAP (MISCELLANEOUS) IMPLANT
KIT TURNOVER KIT A (KITS) ×1 IMPLANT
LOOP CUT BIPOLAR 24F LRG (ELECTROSURGICAL) ×1 IMPLANT
MANIFOLD NEPTUNE II (INSTRUMENTS) ×1 IMPLANT
MAT ABSORB FLUID 56X50 GRAY (MISCELLANEOUS) ×1 IMPLANT
PACK CYSTO (CUSTOM PROCEDURE TRAY) ×1 IMPLANT
PACK DRAPE AQUABEAM (MISCELLANEOUS) ×1 IMPLANT
PAD PREP 24X48 CUFFED NSTRL (MISCELLANEOUS) ×1 IMPLANT
SYR 30ML LL (SYRINGE) ×1 IMPLANT
SYRINGE TOOMEY IRRIG 70ML (MISCELLANEOUS) ×2 IMPLANT
TOWEL OR 17X26 10 PK STRL BLUE (TOWEL DISPOSABLE) ×1 IMPLANT
TUBING CONNECTING 10 (TUBING) ×2 IMPLANT
TUBING UROLOGY SET (TUBING) ×1 IMPLANT
UNDERPAD 30X36 HEAVY ABSORB (UNDERPADS AND DIAPERS) ×1 IMPLANT

## 2023-11-07 NOTE — Anesthesia Postprocedure Evaluation (Signed)
 Anesthesia Post Note  Patient: Tony Hall  Procedure(s) Performed: ABLATION, PROSTATE, TRANSURETHRAL, USING WATERJET     Patient location during evaluation: PACU Anesthesia Type: General Level of consciousness: sedated and patient cooperative Pain management: pain level controlled Vital Signs Assessment: post-procedure vital signs reviewed and stable Respiratory status: spontaneous breathing Cardiovascular status: stable Anesthetic complications: no   No notable events documented.  Last Vitals:  Vitals:   11/07/23 1100 11/07/23 1108  BP: 126/85 138/85  Pulse: 71 89  Resp: 12 20  Temp:  (!) 36.4 C  SpO2: 99% 100%    Last Pain:  Vitals:   11/07/23 1108  TempSrc: Axillary  PainSc: 0-No pain                 Norleen Pope

## 2023-11-07 NOTE — Anesthesia Procedure Notes (Signed)
 Procedure Name: Intubation Date/Time: 11/07/2023 7:40 AM  Performed by: Belvie Valri NOVAK, CRNAPre-anesthesia Checklist: Patient identified, Emergency Drugs available, Suction available and Patient being monitored Patient Re-evaluated:Patient Re-evaluated prior to induction Oxygen Delivery Method: Circle System Utilized Preoxygenation: Pre-oxygenation with 100% oxygen Induction Type: IV induction Ventilation: Oral airway inserted - appropriate to patient size and Two handed mask ventilation required Laryngoscope Size: Mac and 3 Grade View: Grade IV Tube type: Oral Tube size: 7.5 mm Number of attempts: 1 Airway Equipment and Method: Stylet and Oral airway Placement Confirmation: ETT inserted through vocal cords under direct vision, positive ETCO2 and breath sounds checked- equal and bilateral Secured at: 23 cm Tube secured with: Tape Dental Injury: Teeth and Oropharynx as per pre-operative assessment and Injury to tongue

## 2023-11-07 NOTE — Op Note (Signed)
 Preoperative diagnosis: BPH with lower urinary tract symptoms, weak stream, frequency  Postoperative diagnosis: Same   Procedure: Robotic water  jet ablation of the prostate   Surgeon: Sherwood Edison, MD  Anesthesia: General   Indication for procedure:   Findings:  Cystoscopy revealed trilobar hypertrophy with a median lobe  Description of procedure:  He was brought to the operating room and placed supine on the operating table.  After adequate anesthesia he was placed lithotomy position. Timeout was performed to confirm the patient and procedure. The TRUS Stepper was mounted to the Articulating Arm and secured to OR bed. The ultrasound probe was attached to the stepper. Exam under anesthesia was performed and the TRUS was inserted per rectum.  There was no resistance. The ultrasound probe was aligned, and confirmation made that the prostate is centered and aligned using both transverse and sagittal views. The bladder neck, verumontanum and the central/transition zones were identified.  Genitalia were prepped and draped in the usual sterile fashion. The 81F AQUABEAM Handpiece is inserted into the prostatic urethra and a complete cystoscopic evaluation was performed by inspecting the prostate, bladder, and identifying the location of the verumontanum/external sphincter. The AQUABEAM Handpiece was secured to the Handpiece Articulating Arm. Confirmed alignment of AQUABEAM Handpiece and TRUS Probe to be parallel and colinear. Confirmation that AQUABEAM nozzle is centered and anterior of the bladder neck or the median lobe. The cystoscope was then retracted to visualize the verumontanum and external sphincter and the cystoscope tip was positioned just proximal to the external sphincter. Reconfirmed alignment of the TRUS probe with the AQUABEAM Handpiece and compression applied with TRUS probe. Horizontal alignment of the Handpiece waterjet nozzle was performed. The Aquablation treatment zones were planned  utilizing real-time TRUS to visualize the contour of the prostate and the depth and radial angles of resection were defined in the transverse view. In the sagittal view, the AQUABEAM nozzle is identified and position registered with software. The treatment contours were then adjusted to conform to the intended resection margins. The median lobe, bladder neck and verumontanum were marked and confirmed in the treatment contour. The Aquablation Treatment was then started following the resection contour confirmed under ultrasound guidance.   Once Aquablation resection was complete the 24 French aqua beam handpiece was carefully removed.  The continuous-flow sheath with the visual obturator was passed and then the loop and handle.  The trigone and the ureteral orifices were identified.  Resection of some of the residual median lobe and bladder neck tissue was done.  The bladder neck was identified at 6:00 and this was taken up to 12:00 with fulguration of the bladder neck and prostate for hemostasis.  Similarly from 6:00 up to 12:00 on the left side of the bladder neck was identified by resecting some of the ablated tissue to identify the bladder neck and cauterize any bleeding.  Some anterior tissue was resected.  This created excellent hemostasis.  All the chips were evacuated.  Ureteral orifices again identified and noted to be normal without injury.  The scope was backed out and a 24 [place] hematuria catheter was placed with 30 cc in the balloon.  The balloon was seated at the bladder neck and it was irrigated on light traction and noted to be clear to pink.  He was hooked up to CBI.  He was cleaned up and placed supine.  Catheter was placed on traction.  He was awakened and taken to the cover room in stable condition.  Complications: None  Blood loss:  100 mL  Specimens: None  Drains: 24 [place] three-way hematuria catheter with 30 cc in the balloon  Disposition: Patient stable to PACU

## 2023-11-07 NOTE — Transfer of Care (Signed)
 Immediate Anesthesia Transfer of Care Note  Patient: Tony Hall  Procedure(s) Performed: ABLATION, PROSTATE, TRANSURETHRAL, USING WATERJET  Patient Location: PACU  Anesthesia Type:General  Level of Consciousness: awake  Airway & Oxygen Therapy: Patient Spontanous Breathing and Patient connected to face mask oxygen  Post-op Assessment: Report given to RN and Post -op Vital signs reviewed and stable  Post vital signs: Reviewed and stable  Last Vitals:  Vitals Value Taken Time  BP 131/87 11/07/23 09:05  Temp    Pulse 87 11/07/23 09:07  Resp 13 11/07/23 09:07  SpO2 100 % 11/07/23 09:07  Vitals shown include unfiled device data.  Last Pain:  Vitals:   11/07/23 0604  TempSrc:   PainSc: 0-No pain      Patients Stated Pain Goal: 3 (11/07/23 0604)  Complications: No notable events documented.

## 2023-11-07 NOTE — Discharge Instructions (Signed)
 Transurethral Resection of the Prostate (TURP) or Greenlight laser ablation of the Prostate  Care After  Refer to this sheet in the next few weeks. These discharge instructions provide you with general information on caring for yourself after you leave the hospital. Your caregiver may also give you specific instructions. Your treatment has been planned according to the most current medical practices available, but unavoidable complications sometimes occur. If you have any problems or questions after discharge, please call your caregiver.  HOME CARE INSTRUCTIONS   Medications  You may receive medicine for pain management. As your level of discomfort decreases, adjustments in your pain medicines may be made.   Take all medicines as directed.   You may be given a medicine (antibiotic) to kill germs following surgery. Finish all medicines. Let your caregiver know if you have any side effects or problems from the medicine.   If you are on aspirin, it would be best not to restart the aspirin until the blood in the urine clears Hygiene  You can take a shower after surgery.   You should not take a bath while you still have the urethral catheter. Activity  You will be encouraged to get out of bed as much as possible and increase your activity level as tolerated.   Spend the first week in and around your home. For 3 weeks, avoid the following:   Straining.   Running.   Strenuous work.   Walks longer than a few blocks.   Riding for extended periods.   Sexual relations.   Do not lift heavy objects (more than 20 pounds) for at least 1 month. When lifting, use your arms instead of your abdominal muscles.   You will be encouraged to walk as tolerated. Do not exert yourself. Increase your activity level slowly. Remember that it is important to keep moving after an operation of any type. This cuts down on the possibility of developing blood clots.   Your caregiver will tell you when you  can resume driving and light housework. Discuss this at your first office visit after discharge. Diet  No special diet is ordered after a TURP. However, if you are on a special diet for another medical problem, it should be continued.   Normal fluid intake is usually recommended.   Avoid alcohol and caffeinated drinks for 2 weeks. They irritate the bladder. Decaffeinated drinks are okay.   Avoid spicy foods.  Bladder Function  For the first 10 days, empty the bladder whenever you feel a definite desire. Do not try to hold the urine for long periods of time.   Urinating once or twice a night even after you are healed is not uncommon.   You may see some recurrence of blood in the urine after discharge from the hospital. This usually happens within 2 weeks after the procedure.If this occurs, force fluids again as you did in the hospital and reduce your activity.  Bowel Function  You may experience some constipation after surgery. This can be minimized by increasing fluids and fiber in your diet. Drink enough water and fluids to keep your urine clear or pale yellow.   A stool softener may be prescribed for use at home. Do not strain to move your bowels.   If you are requiring increased pain medicine, it is important that you take stool softeners to prevent constipation. This will help to promote proper healing by reducing the need to strain to move your bowels.  Sexual Activity  Semen movement  in the opposite direction and into the bladder (retrograde ejaculation) may occur. Since the semen passes into the bladder, cloudy urine can occur the first time you urinate after intercourse. Or, you may not have an ejaculation during erection. Ask your caregiver when you can resume sexual activity. Retrograde ejaculation and reduced semen discharge should not reduce one's pleasure of intercourse.  Postoperative Visit  Arrange the date and time of your after surgery visit with your caregiver.  Return  to Work  After your recovery is complete, you will be able to return to work and resume all activities. Your caregiver will inform you when you can return to work.    Foley Catheter Care A soft, flexible tube (Foley catheter) may have been placed in your bladder to drain urine and fluid. Follow these instructions: Taking Care of the Catheter  Keep the area where the catheter leaves your body clean.   Attach the catheter to the leg so there is no tension on the catheter.   Keep the drainage bag below the level of the bladder, but keep it OFF the floor.   Do not take long soaking baths. Your caregiver will give instructions about showering.   Wash your hands before touching ANYTHING related to the catheter or bag.   Using mild soap and warm water on a washcloth:   Clean the area closest to the catheter insertion site using a circular motion around the catheter.   Clean the catheter itself by wiping AWAY from the insertion site for several inches down the tube.   NEVER wipe upward as this could sweep bacteria up into the urethra (tube in your body that normally drains the bladder) and cause infection.   Place a small amount of sterile lubricant at the tip of the penis where the catheter is entering.  Taking Care of the Drainage Bags  Two drainage bags may be taken home: a large overnight drainage bag, and a smaller leg bag which fits underneath clothing.   It is okay to wear the overnight bag at any time, but NEVER wear the smaller leg bag at night.   Keep the drainage bag well below the level of your bladder. This prevents backflow of urine into the bladder and allows the urine to drain freely.   Anchor the tubing to your leg to prevent pulling or tension on the catheter. Use tape or a leg strap provided by the hospital.   Empty the drainage bag when it is 1/2 to 3/4 full. Wash your hands before and after touching the bag.   Periodically check the tubing for kinks to make sure  there is no pressure on the tubing which could restrict the flow of urine.  Changing the Drainage Bags  Cleanse both ends of the clean bag with alcohol before changing.   Pinch off the rubber catheter to avoid urine spillage during the disconnection.   Disconnect the dirty bag and connect the clean one.   Empty the dirty bag carefully to avoid a urine spill.   Attach the new bag to the leg with tape or a leg strap.  Cleaning the Drainage Bags  Whenever a drainage bag is disconnected, it must be cleaned quickly so it is ready for the next use.   Wash the bag in warm, soapy water.   Rinse the bag thoroughly with warm water.   Soak the bag for 30 minutes in a solution of white vinegar and water (1 cup vinegar to 1 quart warm  water).   Rinse with warm water.  SEEK MEDICAL CARE IF:   You have chills or night sweats.   You are leaking around your catheter or have problems with your catheter. It is not uncommon to have sporadic leakage around your catheter as a result of bladder spasms. If the leakage stops, there is not much need for concern. If you are uncertain, call your caregiver.   You develop side effects that you think are coming from your medicines.  SEEK IMMEDIATE MEDICAL CARE IF:   You are suddenly unable to urinate. Check to see if there are any kinks in the drainage tubing that may cause this. If you cannot find any kinks, call your caregiver immediately. This is an emergency.   You develop shortness of breath or chest pains.   Bleeding persists or clots develop in your urine.   You have a fever.   You develop pain in your back or over your lower belly (abdomen).   You develop pain or swelling in your legs.   Any problems you are having get worse rather than better.  MAKE SURE YOU:   Understand these instructions.   Will watch your condition.   Will get help right away if you are not doing well or get worse.

## 2023-11-07 NOTE — H&P (Signed)
 CC/HPI: Tony Hall is a 63 year-old male established patient who is here for follow up of BPH and lower urinary tract symptoms.   Patient is currently treated with Tamsulosin 0.4 mg. for his symptoms. The patient complains of lower urinary tract symptom(s) that include frequency and nocturia. The patient states his most bothersome symptom(s) are the following: nocturia.   He denies any other associated symptoms. The patient states if he were to spend the rest of his life with his current urinary condition, he would be mixed. His urinary symptoms have been stable since his last office visit. This condition would be considered of mild to moderate severity with no modifying factors or associated signs or symptoms other than as noted above.   12/05/18: He returns today for follow-up of voice and BPH. He is currently taking tamsulosin 0.4 mg. He has tried Myrbetriq in the past but it was minimally effective and too expensive. He said he is still getting up about 3 times at night and has daytime frequency. He is interested further options for treatment of his voiding symptoms. No dysuria or   -12/17/19-patient with history of BPH and nocturia as above. Has been managed with tamsulosin 0.4 mg daily which he still takes. He has also tried Myrbetriq and most recently Detrol LA with minimal change in the nocturia. He states he has 3-4 times nocturia minimal and has difficult time getting sleep at night. States the force of stream is reasonable..  Urinalysis today is clear on urine spun sediment. Postvoid residual equals 83 cc.   Second issue is that of erectile dysfunction. Patient states over the last year so he has had more difficulty sustaining an achieving erections for intercourse. Denies any pain or curvature with erections. He has never tried any PD 5 inhibitors.   -01/18/20-patient with history of BPH and nocturia as well as erectile dysfunction as above. Has kept a voiding diary for me in the interim. Here  to review.   -02/28/20-patient with history of BPH and nocturia. Recently placed on Gemtesa to see if this would help with nocturia. Here for follow-up. He has noted no significant change while on this medicine. He did note that urinary flow rate reduced taking this medicine. His micro urinalysis today is clear on urine spun sediment. Postvoid residual is 113cc.   Notes: Trial of Rapaflo 8 mg daily as prescription management. Will take this in lieu of the tamsulosin. See back here in a month for follow-up PVR. If unimproved consider voiding pressure study/urodynamics and/or cysto.  -07/17/20-patient with history of BPH as above. Was initiated on silodosin back in November of 2021 and was scheduled for 1 month follow-up but missed that follow-up appointment. He has noted in the interim no significant change after trying the silodosin. He has continued to have daytime and nighttime frequency every 2 hours or so. On previous voiding diary his average voided volumes were on the low side with maximum voided volumes being about 7 oz but typical voided volumes be morning on the 3-4 oz range.SABRA  Postvoid residual today is 68 cc. Urinalysis is clear on urine spun sediment  -08/29/20-patient with history of BPH and nocturia and urinary frequency. Has had urodynamic evaluation on 08/12/2020 which was reviewed today. This showed maximum capacity of 426 cc. There was a unstable bladder contraction with pressure of 130 cm of water . Maximum flow rate was 9 mL/second. High-pressure low-flow consistent with obstructive voiding pattern. Here to discuss results and next steps of management.  Discussed  urodynamic findings showing high-pressure low-flow state suggestive of obstructive voiding pattern. Recommended that we proceed with cysto to assess prostatic anatomy and to exclude bladder pathology. Then will make further disposition regarding possible intervention for his urinary symptomatology  -09/11/20-patient with history of  BPH in obstructive voiding pattern as above. Here for cysto to assess bladder and prostate.  Cysto is performed today and shows: Prostate with median lobe hypertrophy. See note below   -04/13/21-with history of BPH with trilobar prostatic hypertrophy. Was initiated on finasteride 6 months ago and has also remained on tamsulosin 0.4 mg daily.Iin the interim he has noted some improvement in terms of decreased nocturia and increased force of stream.  Micro urinalysis is clear on urine spun sediment  Post void residual equals:12cc  -09/07/21-patient with history of BPH with trilobar prostatic hypertrophy. Has been on finasteride 5 mg daily and recently daily. In the interim he has noted problems with slow stream and some urinary frequency and nocturia.  Micro urinalysis is clear on urine spun sediment  Postvoid residual equals: 120 cc   -06/04/22-patient with history of BPH and trilobar prostatic hypertrophy. Currently managed on finasteride 5 mg daily and tamsulosin 0.4 mg bid. Minimal voiding issues when he takes his medicine regularly here for 56-month follow-up.  Micro urinalysis is clear on urine spun sediment  Postvoid residual equals: 88 cc   06/22/2023: Previously followed by Dr. Rosalind with above-noted history. Back today for follow-up evaluation. He remains on tamsulosin once daily as well as finasteride. He found that twice daily tamsulosin had no substantial impact on baseline LUTS. IPSS today is 19. PVR 55 mL, UA normal. He continues with combination of predominantly obstructive but some irritative voiding symptoms including nocturia times many. No interval dysuria, gross hematuria or interval treatment for UTI. He has some moderate interest in a possible definitive outlet reduction procedure. Prior cystoscopic evaluation show tried lobular hypertrophy, urodynamics showed an obstructive voiding pattern. Of note PSA at last exam was 3.12. He was started on finasteride then, he is due for repeat  PSA exam today.   07/13/2023  Patient with a history of BPH with trilobar hypertrophy on cystoscopy in the past. Also has a prior urodynamics 3 years ago that showed high-pressure voiding with a low flow state consistent with obstruction. He continues on tamsulosin but is no longer taking finasteride. PSA was normal at 3. He is interested in proceeding with procedural management for his BPH which continues to be bothersome to him.   09/01/2023  Patient underwent a UroCuff that revealed the following:   UroCuff Summary  Patient's desire to void was 7 on a scale of 1 to 10. Patient voided 167 mL. The maximum flow (Qmax) was 5.1 mL/s and highest pressure at flow interruption (PcuffInt) was 200.0cm H2O. Abdominal and perianal EMG was measured during the study. The calculated pressure-flow study (PFS) score was 40%. An automatic analysis algorithm calculated the pressure/flow point based on the inflations in the study. When plotted onto the modified nomogram this calculated pressure/flow point lies in the nomogram quadrant that suggests a finding of Obstructed.   PVR was 97. Prostate volume 89.7 with a prominent median lobe component.   11/07/2023 Patient presents today for aquablation.   ALLERGIES: No Allergies    MEDICATIONS: Sildenafil Citrate 20 MG Tablet Take 1-5 tabs PO QD prn  Tamsulosin HCl 0.4 MG CP24 Oral     GU PSH: Complex cystometrogram, w/ void pressure and urethral pressure profile studies, any technique - 2022 Complex  cystometrogram, with voiding pressure studies, any technique - 08/16/2023 Complex Uroflow - 08/16/2023, 11-27-20 Cystoscopy - 11-27-2020 Emg surf Electrd - 08/16/2023, 11/27/20 Inject For cystogram - 11/27/2020 Intrabd voidng Press - 27-Nov-2020       PSH Notes: Cauterization Of Hemorrhoids, Colonoscopy (Fiberoptic)   NON-GU PSH: Diagnostic Colonoscopy - 2010-11-28 Visit Complexity (formerly [facility]) - 07/13/2023         GU PMH: BPH w/LUTS - 08/16/2023, - 07/13/2023, - 06/22/2023, - 06/04/2022, -  09/07/2021, - 04/13/2021, 11-27-2020, 11-27-20, 11/27/2020, 11-28-2019, 11-28-19, BPH associated with nocturia, - 28-Nov-2014 Incomplete bladder emptying - 08/16/2023, - 07/13/2023, - 06/22/2023, - 06/04/2022, - 09/07/2021 Nocturia - 08/16/2023, - 07/13/2023, - 04/13/2021, 11/27/2020, 11/27/20, 27-Nov-2020, 11-28-2019, November 28, 2019 (Stable), - 2019/11/28 (Stable), He has nocturia and frequency. I have recommended we 1st try increasing his tamsulosin which was effective at controlling some of his symptoms in the past. If this is effective he will contact me. If it is not he is going to let me know which of the anticholinergics are covered by his insurance plan and will contact me so I can send in a prescription for 1 of these medications to see if this helps control his daytime frequency and nocturia., 11-27-2017 Urinary Frequency - 08/16/2023, - 07/13/2023, - 06/22/2023, - 09/07/2021, - 04/13/2021, 11/27/20, Nov 27, 2020, Nov 27, 2020, 11-27-20 Weak Urinary Stream - 08/16/2023, - 07/13/2023 ED due to arterial insufficiency - Nov 28, 2019, Nov 28, 2019 Encounter for Prostate Cancer screening (Stable), His prostate was noted to be smooth and benign. His PSA remains normal at 1.5. He will return again in a year for re-evaluation and prostate cancer screening. 11-28-2018      PMH Notes: BPH with associated nocturia: He reported improvement in his symptoms with Flomax but continued to have nocturia x2 and some daytime frequency and mild urgency as well. He took Flomax for about a year and then was off of that for about a year. He did note that taking the Flomax resulted in a stronger urinary stream but he continued to have the nocturia which was his primary concern since he has difficulty falling back to sleep.  Current therapy: Tamsulosin 0.4 mg.         NON-GU PMH: Encounter for general adult medical examination without abnormal findings, Encounter for preventive health examination - November 28, 2014    FAMILY HISTORY: Death - Mother, Father Diabetes - Runs In Family Hypertension - Runs In Family   SOCIAL  HISTORY: Marital Status: Married Preferred Language: English; Ethnicity: Not Hispanic Or Latino; Race: Black or African American Current Smoking Status: Patient has never smoked.  <DIV'  Tobacco Use Assessment Completed:  Used Tobacco in last 30 days? Does not use smokeless tobacco. Does drink.  Does not use drugs. Drinks 3 caffeinated drinks per day. Has not had a blood transfusion. Patient's occupation Environmental consultant.. </DIV'    Notes: Occupation:, Father deceased, Number of children, Never a smoker, Mother deceased, Caffeine use, Alcohol Use, Marital History - Currently Married   REVIEW OF SYSTEMS:     GU Review Male:  Patient denies frequent urination, hard to postpone urination, burning/ pain with urination, get up at night to urinate, leakage of urine, stream starts and stops, trouble starting your stream, have to strain to urinate , erection problems, and penile pain.    Gastrointestinal (Upper):  Patient denies nausea, vomiting, and indigestion/ heartburn.    Gastrointestinal (Lower):  Patient denies diarrhea and constipation.  Constitutional:  Patient denies weight loss, night sweats, fever, and fatigue.    Skin:  Patient denies skin rash/ lesion and itching.    Eyes:  Patient denies blurred vision and double vision.    Ears/ Nose/ Throat:  Patient denies sore throat and sinus problems.    Hematologic/Lymphatic:  Patient denies swollen glands and easy bruising.    Cardiovascular:  Patient denies leg swelling and chest pains.    Respiratory:  Patient denies cough and shortness of breath.    Endocrine:  Patient denies excessive thirst.    Musculoskeletal:  Patient denies back pain and joint pain.    Neurological:  Patient denies headaches and dizziness.    Psychologic:  Patient denies depression and anxiety.    VITAL SIGNS: BP (!) 152/97   Pulse 92   Temp 98.3 F (36.8 C) (Oral)   Resp 15   Ht 5' 11 (1.803 m)   Wt 88 kg   SpO2 99%   BMI 27.06 kg/m        MULTI-SYSTEM PHYSICAL EXAMINATION:      Constitutional: Well-nourished. No physical deformities. Normally developed. Good grooming.     Gastrointestinal: No mass, no tenderness, no rigidity, non obese abdomen.     Eyes: Normal conjunctivae. Normal eyelids.     Musculoskeletal: Normal gait and station of head and neck.             ASSESSMENT:     ICD-10 Details  1 GU:  BPH w/LUTS - N40.1 Chronic, Stable  2  Incomplete bladder emptying - R39.14 Chronic, Stable  3  Nocturia - R35.1 Chronic, Stable  4  Urinary Frequency - R35.0 Chronic, Stable  5  Weak Urinary Stream - R39.12 Chronic, Stable   PLAN:   Document  Letter(s):  Created for Patient: Clinical Summary   Notes:  The patient has failed medical management for his lower urinary tract symptoms. He would like to proceed with surgical resection. I discussed aquablation of the prostate. I specifically discussed the risks including but not limited to bleeding which could require blood transfusion, infection, and injury to surrounding structures. Also discussed the possibility that the surgery would not improve symptoms though most men have a great improvement in their symptoms. Also discussed the low likelihood but possibility of development of new symptoms such as irritative voiding symptoms or urinary incontinence. Most men will have some degree of urinary urgency and discomfort immediately following the surgery that resolves in a short amount of time. He understands that most often this is an outpatient procedure but occasionally patients require hospitalization for continuous bladder irrigation in the event of excess bleeding. He also understands the possibility of being sent home with a urethral catheter. The patient expressed understanding and is eager to proceed.

## 2023-11-08 LAB — SURGICAL PATHOLOGY

## 2024-04-20 ENCOUNTER — Other Ambulatory Visit: Payer: Self-pay | Admitting: Family Medicine

## 2024-04-20 DIAGNOSIS — M5431 Sciatica, right side: Secondary | ICD-10-CM

## 2024-04-21 ENCOUNTER — Ambulatory Visit
Admission: RE | Admit: 2024-04-21 | Discharge: 2024-04-21 | Disposition: A | Source: Ambulatory Visit | Attending: Family Medicine | Admitting: Family Medicine

## 2024-04-21 DIAGNOSIS — M5431 Sciatica, right side: Secondary | ICD-10-CM

## 2024-05-10 ENCOUNTER — Encounter: Payer: Self-pay | Admitting: Orthopedic Surgery

## 2024-06-01 DIAGNOSIS — I10 Essential (primary) hypertension: Secondary | ICD-10-CM | POA: Insufficient documentation

## 2024-06-01 DIAGNOSIS — M5416 Radiculopathy, lumbar region: Secondary | ICD-10-CM | POA: Insufficient documentation

## 2024-06-01 DIAGNOSIS — E7800 Pure hypercholesterolemia, unspecified: Secondary | ICD-10-CM | POA: Insufficient documentation

## 2024-06-01 DIAGNOSIS — G8929 Other chronic pain: Secondary | ICD-10-CM | POA: Insufficient documentation

## 2024-06-01 DIAGNOSIS — M7918 Myalgia, other site: Secondary | ICD-10-CM | POA: Insufficient documentation

## 2024-06-01 DIAGNOSIS — G542 Cervical root disorders, not elsewhere classified: Secondary | ICD-10-CM | POA: Insufficient documentation

## 2024-06-01 DIAGNOSIS — N400 Enlarged prostate without lower urinary tract symptoms: Secondary | ICD-10-CM | POA: Insufficient documentation

## 2024-06-01 DIAGNOSIS — R7303 Prediabetes: Secondary | ICD-10-CM | POA: Insufficient documentation

## 2024-06-01 DIAGNOSIS — J309 Allergic rhinitis, unspecified: Secondary | ICD-10-CM | POA: Insufficient documentation

## 2024-06-01 NOTE — Progress Notes (Unsigned)
 "  Referring Physician:  Regino Slater, MD [address] [place],  [place] [address]  Primary Physician:  Regino Slater, MD  History of Present Illness: 06/01/2024*** Mr. Tony Hall has a history of ***  Low back pain that radiates down right leg causing constant sharp and shooting pain with radiating burning pain down his right buttock/thigh/calf.    Duration: 6 months Location: *** Quality: *** Severity: ***  Precipitating: aggravated by *** Modifying factors: made better by *** Weakness: none Timing: ***  Tobacco use: smokes *** PPD x years. Does not smoke.   Bowel/Bladder Dysfunction: none  Conservative measures:  Physical therapy: *** Has participated in PT at [facility] Multimodal medical therapy including regular antiinflammatories: *** Hydrocodone , Methocarbamol, Medrol Dose Pak, Oxycodone, Tramadol Injections: 05/28/2019 L2-3, L3-4 TESI  Past Surgery: ***none  ENCARNACION SCIONEAUX has ***no symptoms of cervical myelopathy.  The symptoms are causing a significant impact on the patient's life.   Review of Systems:  A 10 point review of systems is negative, except for the pertinent positives and negatives detailed in the HPI.  Past Medical History: Past Medical History:  Diagnosis Date   Anal polyp [id number]   BPH (benign prostatic hyperplasia)    Cervical radiculopathy    Hypertension    Pre-diabetes    Seasonal allergies     Past Surgical History: Past Surgical History:  Procedure Laterality Date   HEMORRHOID SURGERY  04/27/1999   x3 piles.  Dr Velinda Moats   NO PAST SURGERIES      Allergies: Allergies as of 06/12/2024   (No Known Allergies)    Medications: Outpatient Encounter Medications as of 06/12/2024  Medication Sig   amLODipine (NORVASC) 10 MG tablet Take 10 mg by mouth daily.   atorvastatin (LIPITOR) 20 MG tablet Take 20 mg by mouth every evening.   cholecalciferol (VITAMIN D3) 25 MCG (1000 UNIT) tablet Take 1,000 Units by mouth  daily.   HYDROcodone -acetaminophen  (NORCO/VICODIN) 5-325 MG tablet Take 1 tablet by mouth every 6 (six) hours as needed.   Tamsulosin HCl (FLOMAX) 0.4 MG CAPS Take 0.4 mg by mouth every evening.   No facility-administered encounter medications on file as of 06/12/2024.    Social History: Social History[1]  Family Medical History: Family History  Problem Relation Age of Onset   Hypertension Father    Dementia Father     Physical Examination: There were no vitals filed for this visit.  General: Patient is well developed, well nourished, calm, collected, and in no apparent distress. Attention to examination is appropriate.  Respiratory: Patient is breathing without any difficulty.   NEUROLOGICAL:     Awake, alert, oriented to person, place, and time.  Speech is clear and fluent. Fund of knowledge is appropriate.   Cranial Nerves: Pupils equal round and reactive to light.  Facial tone is symmetric.    *** ROM of cervical spine *** pain *** posterior cervical tenderness. *** tenderness in bilateral trapezial region.   *** ROM of lumbar spine *** pain *** posterior lumbar tenderness.   No abnormal lesions on exposed skin.   Strength: Side Biceps Triceps Deltoid Interossei Grip Wrist Ext. Wrist Flex.  R 5 5 5 5 5 5 5   L 5 5 5 5 5 5 5    Side Iliopsoas Quads Hamstring PF DF EHL  R 5 5 5 5 5 5   L 5 5 5 5 5 5    Reflexes are ***2+ and symmetric at the biceps, brachioradialis, patella and achilles.  Hoffman's is absent.  Clonus is not present.   Bilateral upper and lower extremity sensation is intact to light touch.     Gait is normal.   ***No difficulty with tandem gait.    Medical Decision Making  Imaging: ***  I have personally reviewed the images and agree with the above interpretation.  Assessment and Plan: Mr. Clugston is a pleasant 64 y.o. male has ***  Treatment options discussed with patient and following plan made:   - Order for physical therapy for ***  spine ***. Patient to call to schedule appointment. *** - Continue current medications including ***. Reviewed dosing and side effects.  - Prescription for ***. Reviewed dosing and side effects. Take with food.  - Prescription for *** to take prn muscle spasms. Reviewed dosing and side effects. Discussed this can cause drowsiness.  - MRI of *** to further evaluate *** radiculopathy. No improvement time or medications (***).  - Referral to PMR at [facility] to discuss possible *** injections.  - Will schedule phone visit to review MRI results once I get them back.   I spent a total of *** minutes in face-to-face and non-face-to-face activities related to this patient's care today including review of outside records, review of imaging, review of symptoms, physical exam, discussion of differential diagnosis, discussion of treatment options, and documentation.   Thank you for involving me in the care of this patient.   Glade Boys PA-C Dept. of Neurosurgery     [1]  Social History Tobacco Use   Smoking status: Never  Vaping Use   Vaping status: Never Used  Substance Use Topics   Alcohol use: Yes    Comment: 5 GLASSES OF WINE PER WEEK   Drug use: No   "

## 2024-06-12 ENCOUNTER — Ambulatory Visit: Admitting: Orthopedic Surgery
